# Patient Record
Sex: Male | Born: 1935 | Race: Black or African American | Hispanic: No | Smoking: Former smoker
Health system: Southern US, Community
[De-identification: ages and names within clinical notes are randomized; demographics above are authoritative.]

## PROBLEM LIST (undated history)

## (undated) DIAGNOSIS — E7800 Pure hypercholesterolemia, unspecified: Secondary | ICD-10-CM

## (undated) DIAGNOSIS — N400 Enlarged prostate without lower urinary tract symptoms: Secondary | ICD-10-CM

## (undated) DIAGNOSIS — E559 Vitamin D deficiency, unspecified: Secondary | ICD-10-CM

## (undated) DIAGNOSIS — I10 Essential (primary) hypertension: Secondary | ICD-10-CM

## (undated) HISTORY — PX: HERNIA REPAIR: SHX51

## (undated) HISTORY — PX: ANKLE SURGERY: SHX546

---

## 1998-03-26 ENCOUNTER — Other Ambulatory Visit: Admission: RE | Admit: 1998-03-26 | Discharge: 1998-03-26 | Payer: Self-pay | Admitting: Nephrology

## 1998-04-23 ENCOUNTER — Other Ambulatory Visit: Admission: RE | Admit: 1998-04-23 | Discharge: 1998-04-23 | Payer: Self-pay | Admitting: Nephrology

## 2002-11-25 ENCOUNTER — Ambulatory Visit (HOSPITAL_COMMUNITY): Admission: RE | Admit: 2002-11-25 | Discharge: 2002-11-25 | Payer: Self-pay | Admitting: *Deleted

## 2002-11-25 ENCOUNTER — Encounter: Payer: Self-pay | Admitting: *Deleted

## 2005-11-16 ENCOUNTER — Encounter: Admission: RE | Admit: 2005-11-16 | Discharge: 2005-11-16 | Payer: Self-pay | Admitting: General Surgery

## 2005-11-19 ENCOUNTER — Ambulatory Visit (HOSPITAL_BASED_OUTPATIENT_CLINIC_OR_DEPARTMENT_OTHER): Admission: RE | Admit: 2005-11-19 | Discharge: 2005-11-19 | Payer: Self-pay | Admitting: General Surgery

## 2007-02-03 IMAGING — CR DG CHEST 2V
2 series · 2 of 2 positions shown · non-contrast
Comparison: none

CLINICAL DATA: Preoperative respiratory exam.  Inguinal hernia.  401.9.
 CHEST ? 2 VIEW:

[view not recorded (1 of 2)]
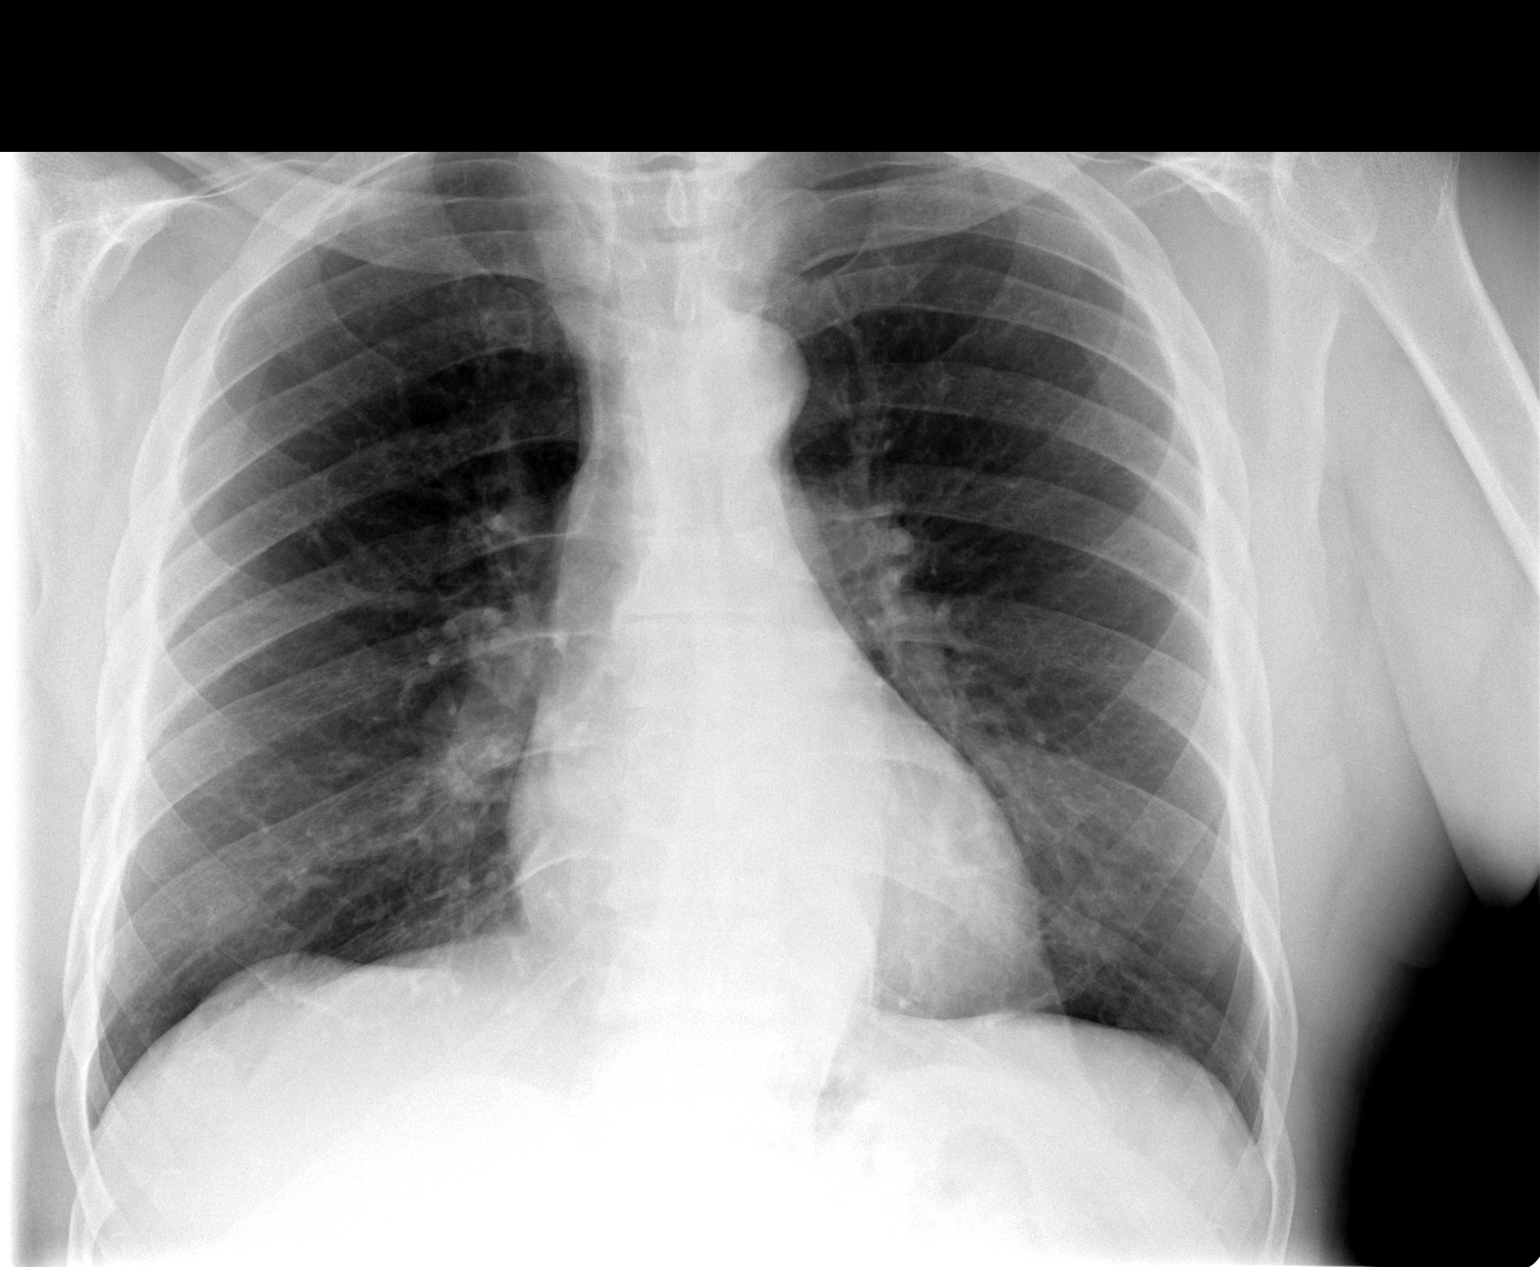

[view not recorded (2 of 2)]
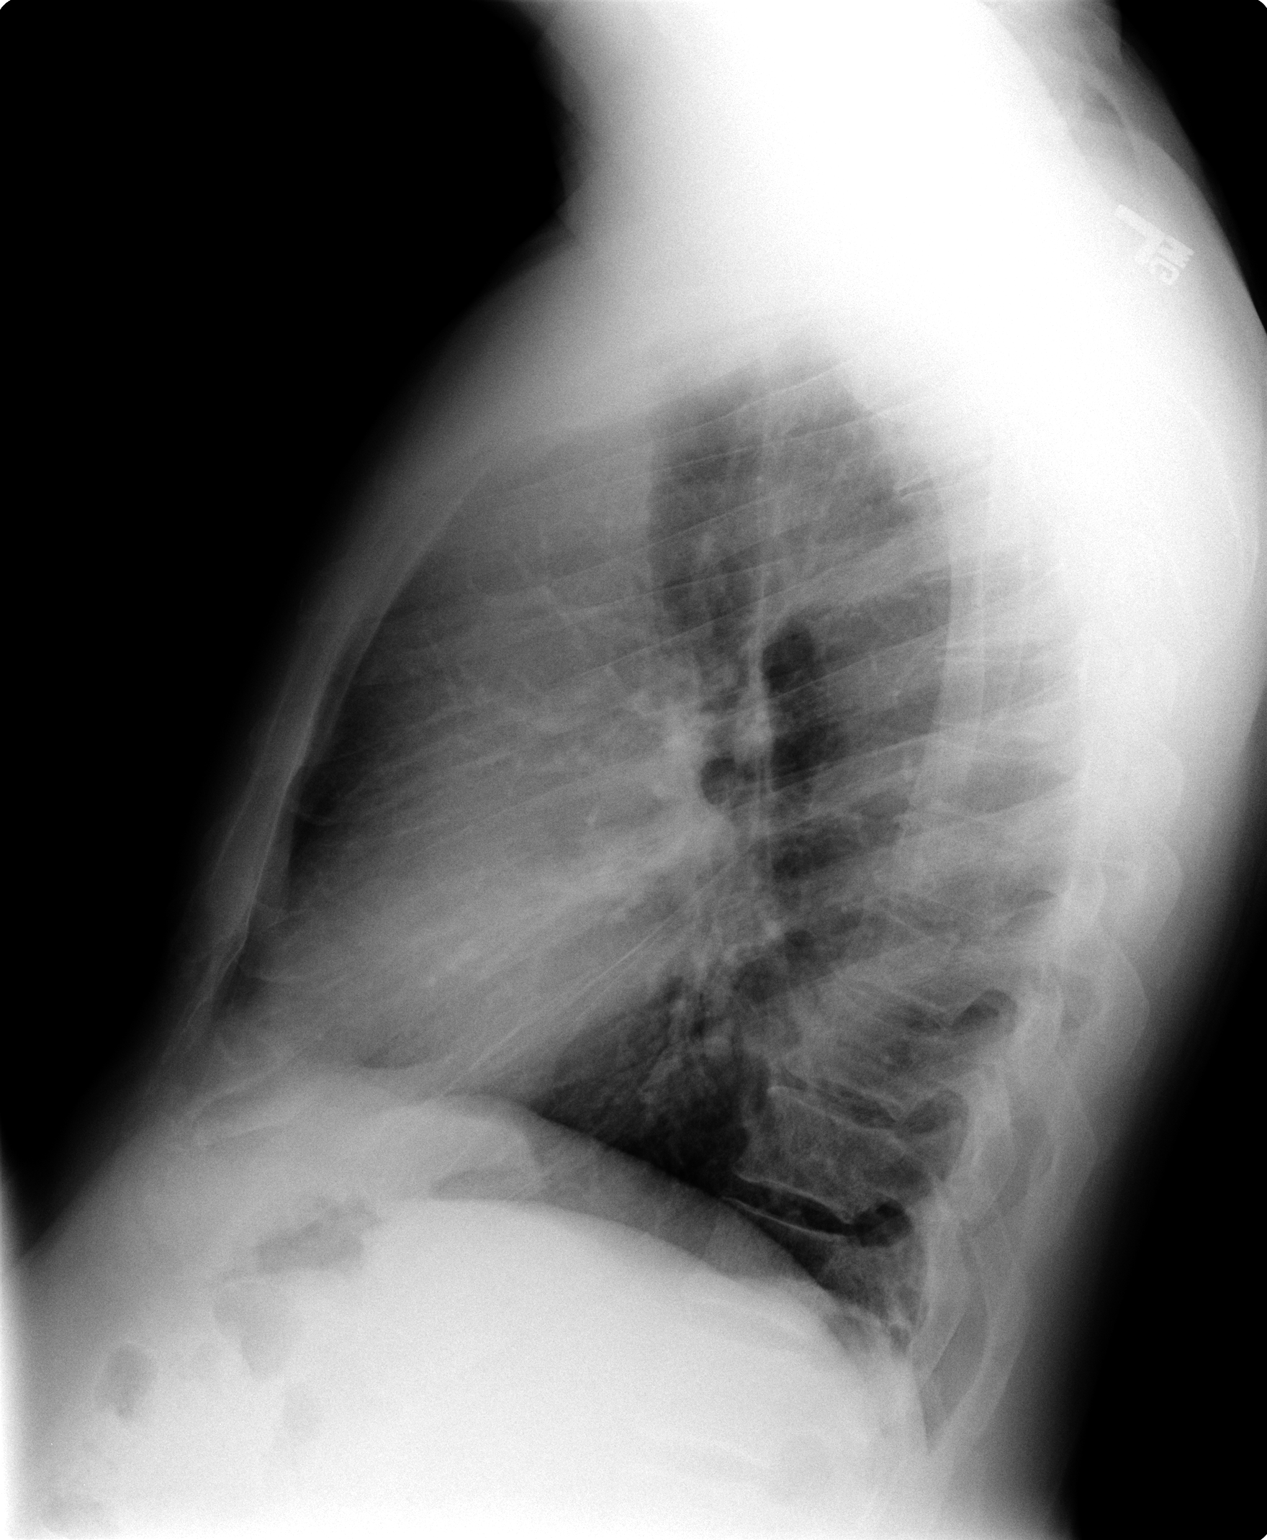

[2 of 2 positions shown; findings below may reference images not displayed]

FINDINGS: Heart size and vascularity are normal and the lungs are clear.   There is some tortuosity of the thoracic aorta.  No significant bony abnormality.
IMPRESSION: No acute disease.

## 2011-06-10 ENCOUNTER — Other Ambulatory Visit: Payer: Self-pay | Admitting: Nephrology

## 2011-06-10 ENCOUNTER — Ambulatory Visit
Admission: RE | Admit: 2011-06-10 | Discharge: 2011-06-10 | Disposition: A | Discharge status: Home / Self Care | Payer: Medicare Other | Source: Ambulatory Visit | Attending: Nephrology | Admitting: Nephrology

## 2011-06-10 DIAGNOSIS — R05 Cough: Secondary | ICD-10-CM

## 2012-05-25 ENCOUNTER — Other Ambulatory Visit: Payer: Self-pay | Admitting: Nephrology

## 2012-06-23 ENCOUNTER — Other Ambulatory Visit: Payer: Self-pay | Admitting: Nephrology

## 2012-07-27 ENCOUNTER — Other Ambulatory Visit: Payer: Self-pay | Admitting: Nephrology

## 2012-08-27 IMAGING — CR DG CHEST 2V
2 series · 2 of 2 positions shown · non-contrast
Comparison: Of the chest x-ray of 11/16/2005

CLINICAL DATA: Cough, weight loss, former smoker

CHEST - 2 VIEW

[w chest pa]
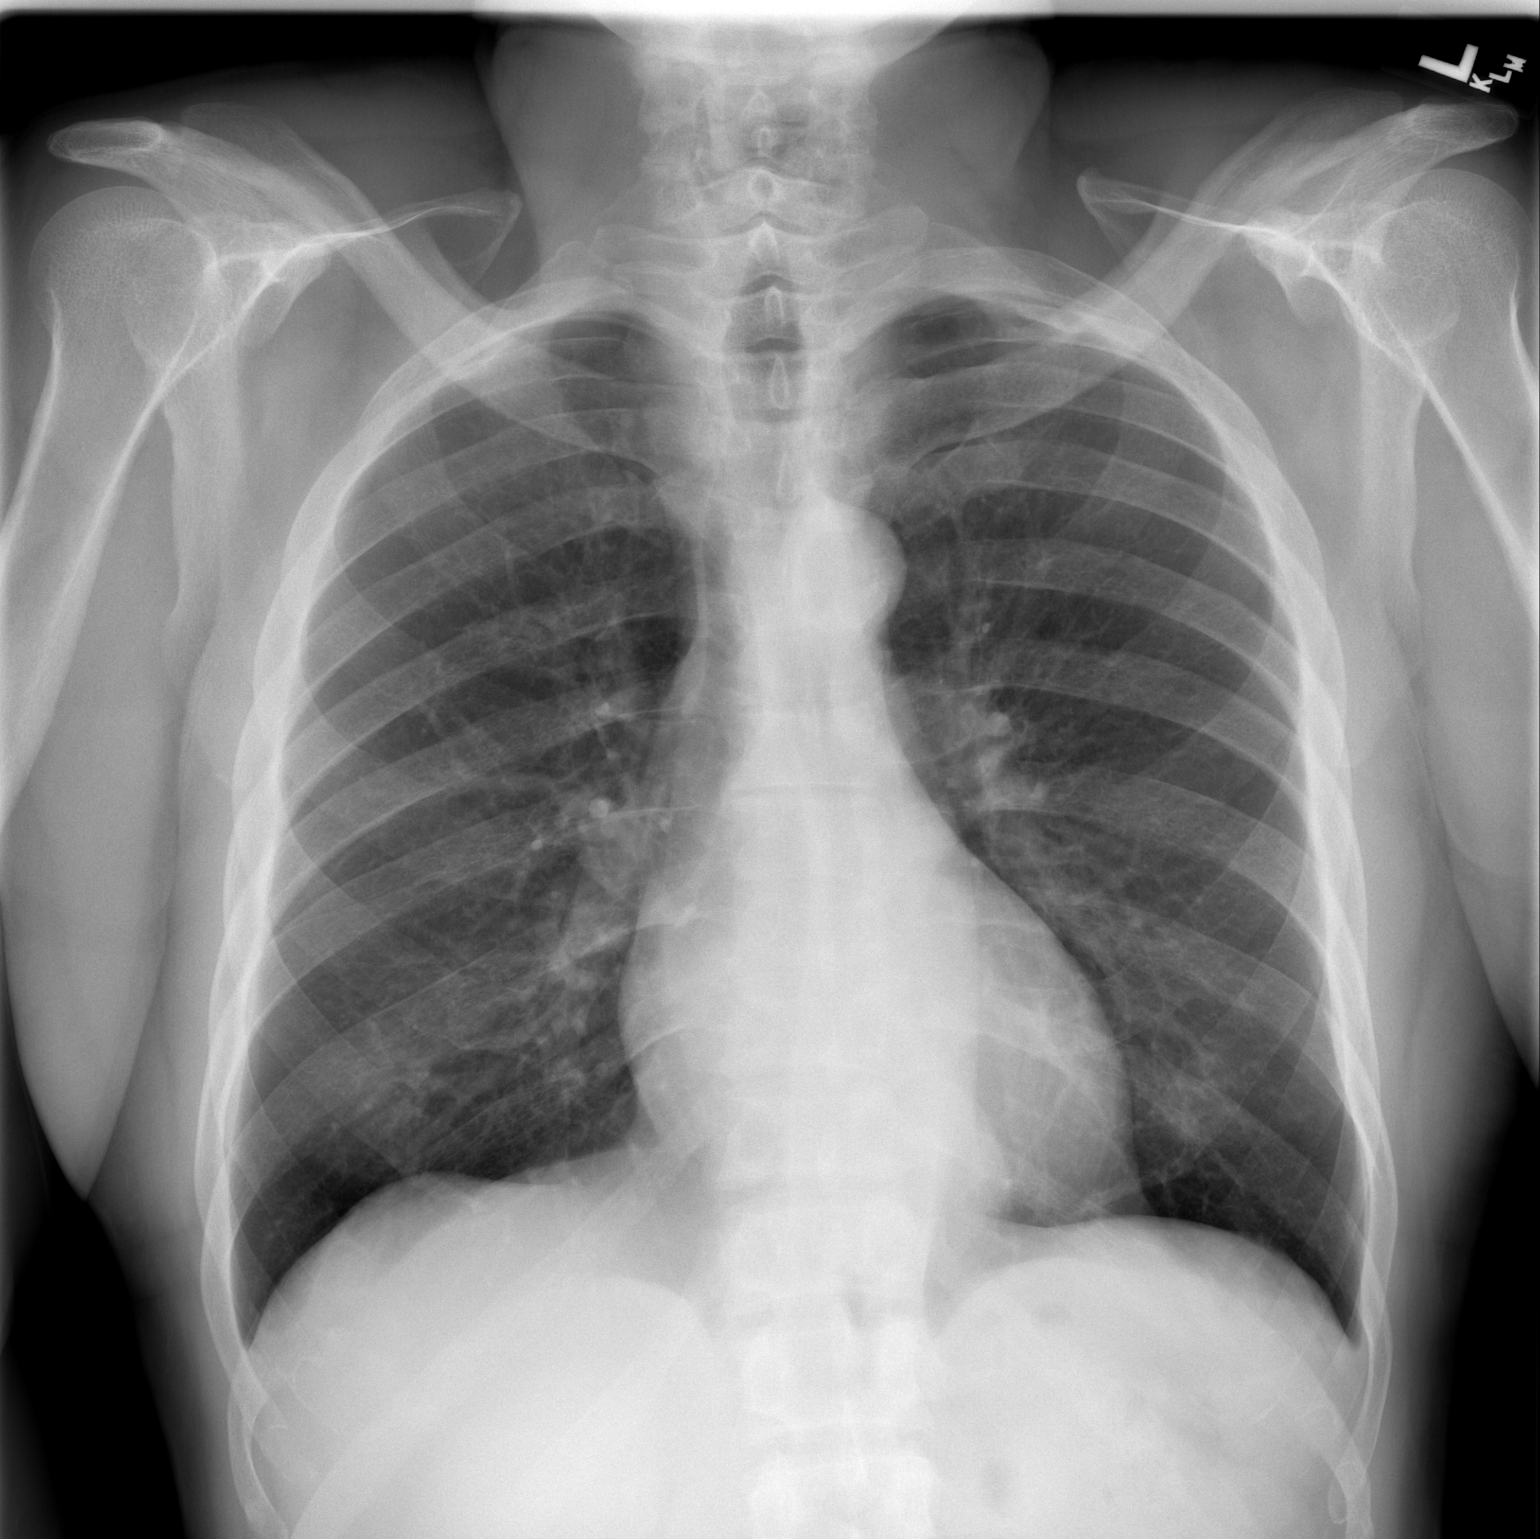

[w chest lat]
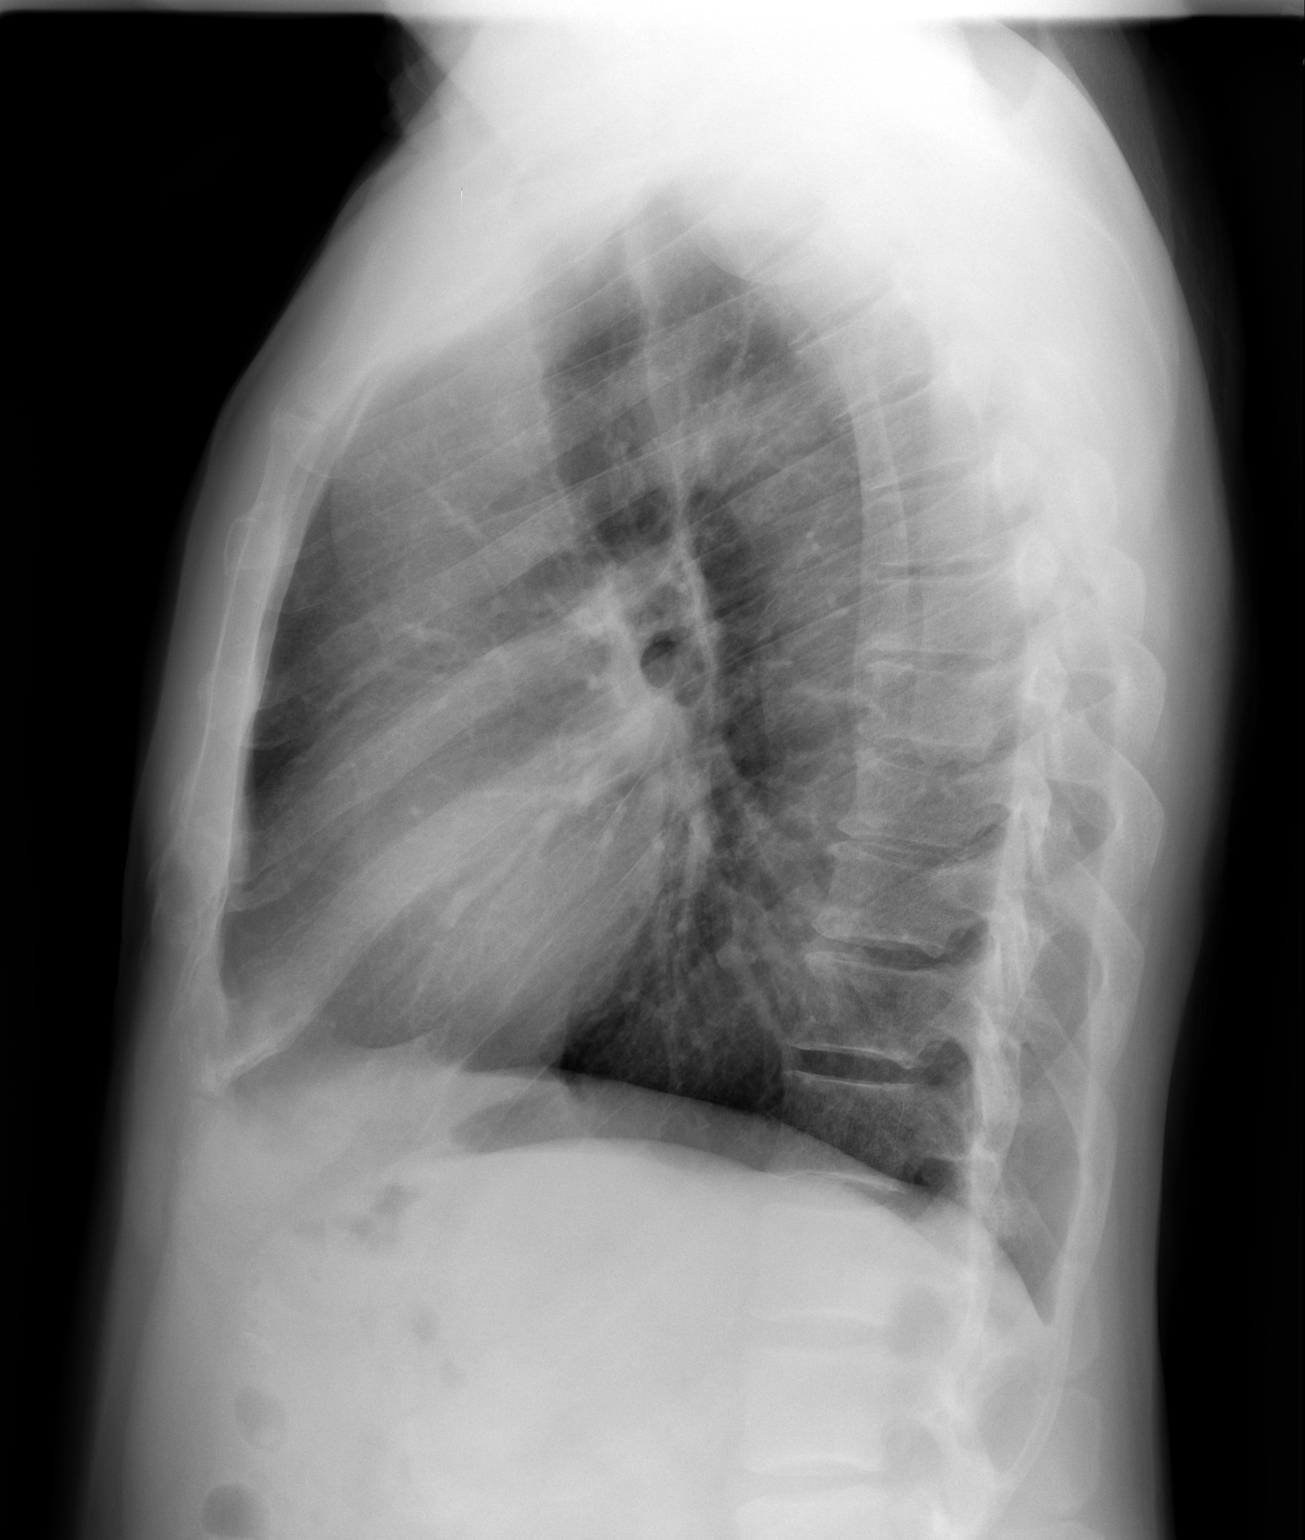

[2 of 2 positions shown; findings below may reference images not displayed]

FINDINGS: The lungs are clear and slightly hyperaerated.
Mediastinal contours are stable.  The heart is within normal limits
in size.  A rounded opacity at the left cardiophrenic angle
probably represents a small hiatal hernia.
IMPRESSION: 1.  No active lung disease.
2.  Probable small hiatal hernia.

## 2015-06-16 DIAGNOSIS — R05 Cough: Secondary | ICD-10-CM | POA: Insufficient documentation

## 2015-06-16 DIAGNOSIS — I159 Secondary hypertension, unspecified: Secondary | ICD-10-CM

## 2015-06-16 DIAGNOSIS — K219 Gastro-esophageal reflux disease without esophagitis: Secondary | ICD-10-CM

## 2015-06-16 DIAGNOSIS — I10 Essential (primary) hypertension: Secondary | ICD-10-CM | POA: Insufficient documentation

## 2015-06-16 DIAGNOSIS — R059 Cough, unspecified: Secondary | ICD-10-CM | POA: Insufficient documentation

## 2015-06-16 DIAGNOSIS — R062 Wheezing: Secondary | ICD-10-CM

## 2015-06-16 DIAGNOSIS — J309 Allergic rhinitis, unspecified: Secondary | ICD-10-CM

## 2015-07-23 ENCOUNTER — Ambulatory Visit (INDEPENDENT_AMBULATORY_CARE_PROVIDER_SITE_OTHER): Payer: Medicare HMO | Admitting: *Deleted

## 2015-07-23 DIAGNOSIS — J309 Allergic rhinitis, unspecified: Secondary | ICD-10-CM

## 2015-07-23 MED ORDER — EPINEPHRINE 0.3 MG/0.3ML IJ SOAJ: 0.3000 mg | Freq: Once | INTRAMUSCULAR | Status: AC

## 2015-07-25 ENCOUNTER — Encounter: Payer: Self-pay | Admitting: *Deleted

## 2015-08-06 ENCOUNTER — Ambulatory Visit (INDEPENDENT_AMBULATORY_CARE_PROVIDER_SITE_OTHER): Payer: Medicare HMO

## 2015-08-06 DIAGNOSIS — J309 Allergic rhinitis, unspecified: Secondary | ICD-10-CM

## 2015-08-19 ENCOUNTER — Ambulatory Visit (INDEPENDENT_AMBULATORY_CARE_PROVIDER_SITE_OTHER): Payer: Medicare HMO | Admitting: *Deleted

## 2015-08-19 DIAGNOSIS — J309 Allergic rhinitis, unspecified: Secondary | ICD-10-CM

## 2015-09-11 ENCOUNTER — Encounter: Payer: Self-pay | Admitting: *Deleted

## 2015-09-11 ENCOUNTER — Ambulatory Visit (INDEPENDENT_AMBULATORY_CARE_PROVIDER_SITE_OTHER): Payer: Medicare HMO | Admitting: *Deleted

## 2015-09-11 DIAGNOSIS — J309 Allergic rhinitis, unspecified: Secondary | ICD-10-CM | POA: Diagnosis not present

## 2015-09-11 NOTE — Progress Notes (Signed)
error 

## 2015-09-22 ENCOUNTER — Ambulatory Visit (INDEPENDENT_AMBULATORY_CARE_PROVIDER_SITE_OTHER): Payer: Medicare HMO

## 2015-09-22 DIAGNOSIS — J309 Allergic rhinitis, unspecified: Secondary | ICD-10-CM | POA: Diagnosis not present

## 2015-10-08 ENCOUNTER — Ambulatory Visit (INDEPENDENT_AMBULATORY_CARE_PROVIDER_SITE_OTHER): Payer: Medicare HMO

## 2015-10-08 DIAGNOSIS — J309 Allergic rhinitis, unspecified: Secondary | ICD-10-CM

## 2015-10-22 DIAGNOSIS — J3089 Other allergic rhinitis: Secondary | ICD-10-CM | POA: Diagnosis not present

## 2015-10-23 DIAGNOSIS — J301 Allergic rhinitis due to pollen: Secondary | ICD-10-CM | POA: Diagnosis not present

## 2015-11-11 ENCOUNTER — Ambulatory Visit (INDEPENDENT_AMBULATORY_CARE_PROVIDER_SITE_OTHER): Payer: Medicare HMO

## 2015-11-11 DIAGNOSIS — J309 Allergic rhinitis, unspecified: Secondary | ICD-10-CM

## 2015-12-10 ENCOUNTER — Ambulatory Visit (INDEPENDENT_AMBULATORY_CARE_PROVIDER_SITE_OTHER): Payer: Medicare HMO

## 2015-12-10 DIAGNOSIS — J309 Allergic rhinitis, unspecified: Secondary | ICD-10-CM

## 2015-12-29 DIAGNOSIS — N400 Enlarged prostate without lower urinary tract symptoms: Secondary | ICD-10-CM | POA: Diagnosis not present

## 2015-12-29 DIAGNOSIS — E785 Hyperlipidemia, unspecified: Secondary | ICD-10-CM | POA: Diagnosis not present

## 2015-12-29 DIAGNOSIS — K219 Gastro-esophageal reflux disease without esophagitis: Secondary | ICD-10-CM | POA: Diagnosis not present

## 2015-12-29 DIAGNOSIS — R7303 Prediabetes: Secondary | ICD-10-CM | POA: Diagnosis not present

## 2015-12-29 DIAGNOSIS — E559 Vitamin D deficiency, unspecified: Secondary | ICD-10-CM | POA: Diagnosis not present

## 2015-12-29 DIAGNOSIS — I10 Essential (primary) hypertension: Secondary | ICD-10-CM | POA: Diagnosis not present

## 2015-12-29 DIAGNOSIS — I119 Hypertensive heart disease without heart failure: Secondary | ICD-10-CM | POA: Diagnosis not present

## 2016-01-07 ENCOUNTER — Ambulatory Visit (INDEPENDENT_AMBULATORY_CARE_PROVIDER_SITE_OTHER): Payer: Medicare HMO

## 2016-01-07 DIAGNOSIS — J309 Allergic rhinitis, unspecified: Secondary | ICD-10-CM | POA: Diagnosis not present

## 2016-01-29 DIAGNOSIS — I10 Essential (primary) hypertension: Secondary | ICD-10-CM | POA: Diagnosis not present

## 2016-01-29 DIAGNOSIS — E559 Vitamin D deficiency, unspecified: Secondary | ICD-10-CM | POA: Diagnosis not present

## 2016-02-05 ENCOUNTER — Ambulatory Visit (INDEPENDENT_AMBULATORY_CARE_PROVIDER_SITE_OTHER): Payer: Medicare HMO | Admitting: *Deleted

## 2016-02-05 DIAGNOSIS — J309 Allergic rhinitis, unspecified: Secondary | ICD-10-CM

## 2016-02-26 DIAGNOSIS — Z0000 Encounter for general adult medical examination without abnormal findings: Secondary | ICD-10-CM | POA: Diagnosis not present

## 2016-02-26 DIAGNOSIS — N401 Enlarged prostate with lower urinary tract symptoms: Secondary | ICD-10-CM | POA: Diagnosis not present

## 2016-02-26 DIAGNOSIS — R972 Elevated prostate specific antigen [PSA]: Secondary | ICD-10-CM | POA: Diagnosis not present

## 2016-02-26 DIAGNOSIS — R351 Nocturia: Secondary | ICD-10-CM | POA: Diagnosis not present

## 2016-02-26 DIAGNOSIS — N138 Other obstructive and reflux uropathy: Secondary | ICD-10-CM | POA: Diagnosis not present

## 2016-02-26 DIAGNOSIS — F5221 Male erectile disorder: Secondary | ICD-10-CM | POA: Diagnosis not present

## 2016-03-02 ENCOUNTER — Ambulatory Visit (INDEPENDENT_AMBULATORY_CARE_PROVIDER_SITE_OTHER): Payer: Medicare HMO

## 2016-03-02 DIAGNOSIS — J309 Allergic rhinitis, unspecified: Secondary | ICD-10-CM

## 2016-03-08 DIAGNOSIS — I10 Essential (primary) hypertension: Secondary | ICD-10-CM | POA: Diagnosis not present

## 2016-03-08 DIAGNOSIS — N400 Enlarged prostate without lower urinary tract symptoms: Secondary | ICD-10-CM | POA: Diagnosis not present

## 2016-03-08 DIAGNOSIS — Z0000 Encounter for general adult medical examination without abnormal findings: Secondary | ICD-10-CM | POA: Diagnosis not present

## 2016-03-08 DIAGNOSIS — I119 Hypertensive heart disease without heart failure: Secondary | ICD-10-CM | POA: Diagnosis not present

## 2016-03-08 DIAGNOSIS — Z136 Encounter for screening for cardiovascular disorders: Secondary | ICD-10-CM | POA: Diagnosis not present

## 2016-03-08 DIAGNOSIS — K219 Gastro-esophageal reflux disease without esophagitis: Secondary | ICD-10-CM | POA: Diagnosis not present

## 2016-03-08 DIAGNOSIS — E785 Hyperlipidemia, unspecified: Secondary | ICD-10-CM | POA: Diagnosis not present

## 2016-03-08 DIAGNOSIS — Z131 Encounter for screening for diabetes mellitus: Secondary | ICD-10-CM | POA: Diagnosis not present

## 2016-03-08 DIAGNOSIS — Z01118 Encounter for examination of ears and hearing with other abnormal findings: Secondary | ICD-10-CM | POA: Diagnosis not present

## 2016-03-11 DIAGNOSIS — I10 Essential (primary) hypertension: Secondary | ICD-10-CM | POA: Diagnosis not present

## 2016-03-11 DIAGNOSIS — E559 Vitamin D deficiency, unspecified: Secondary | ICD-10-CM | POA: Diagnosis not present

## 2016-03-17 ENCOUNTER — Ambulatory Visit (INDEPENDENT_AMBULATORY_CARE_PROVIDER_SITE_OTHER): Payer: Medicare HMO

## 2016-03-17 DIAGNOSIS — J309 Allergic rhinitis, unspecified: Secondary | ICD-10-CM

## 2016-04-05 ENCOUNTER — Ambulatory Visit (INDEPENDENT_AMBULATORY_CARE_PROVIDER_SITE_OTHER): Payer: Medicare HMO

## 2016-04-05 DIAGNOSIS — J309 Allergic rhinitis, unspecified: Secondary | ICD-10-CM

## 2016-04-07 DIAGNOSIS — I10 Essential (primary) hypertension: Secondary | ICD-10-CM | POA: Diagnosis not present

## 2016-04-07 DIAGNOSIS — E559 Vitamin D deficiency, unspecified: Secondary | ICD-10-CM | POA: Diagnosis not present

## 2016-05-03 DIAGNOSIS — E11649 Type 2 diabetes mellitus with hypoglycemia without coma: Secondary | ICD-10-CM | POA: Diagnosis not present

## 2016-05-03 DIAGNOSIS — E785 Hyperlipidemia, unspecified: Secondary | ICD-10-CM | POA: Diagnosis not present

## 2016-05-03 DIAGNOSIS — N400 Enlarged prostate without lower urinary tract symptoms: Secondary | ICD-10-CM | POA: Diagnosis not present

## 2016-05-03 DIAGNOSIS — E559 Vitamin D deficiency, unspecified: Secondary | ICD-10-CM | POA: Diagnosis not present

## 2016-05-03 DIAGNOSIS — I119 Hypertensive heart disease without heart failure: Secondary | ICD-10-CM | POA: Diagnosis not present

## 2016-05-03 DIAGNOSIS — I10 Essential (primary) hypertension: Secondary | ICD-10-CM | POA: Diagnosis not present

## 2016-05-03 DIAGNOSIS — K219 Gastro-esophageal reflux disease without esophagitis: Secondary | ICD-10-CM | POA: Diagnosis not present

## 2016-05-04 ENCOUNTER — Ambulatory Visit (INDEPENDENT_AMBULATORY_CARE_PROVIDER_SITE_OTHER): Payer: Medicare HMO

## 2016-05-04 DIAGNOSIS — J309 Allergic rhinitis, unspecified: Secondary | ICD-10-CM | POA: Diagnosis not present

## 2016-05-10 ENCOUNTER — Ambulatory Visit (INDEPENDENT_AMBULATORY_CARE_PROVIDER_SITE_OTHER): Payer: Medicare HMO | Admitting: *Deleted

## 2016-05-10 DIAGNOSIS — J309 Allergic rhinitis, unspecified: Secondary | ICD-10-CM | POA: Diagnosis not present

## 2016-05-17 DIAGNOSIS — E559 Vitamin D deficiency, unspecified: Secondary | ICD-10-CM | POA: Diagnosis not present

## 2016-05-17 DIAGNOSIS — I10 Essential (primary) hypertension: Secondary | ICD-10-CM | POA: Diagnosis not present

## 2016-05-26 ENCOUNTER — Ambulatory Visit (INDEPENDENT_AMBULATORY_CARE_PROVIDER_SITE_OTHER): Payer: Medicare HMO

## 2016-05-26 DIAGNOSIS — J309 Allergic rhinitis, unspecified: Secondary | ICD-10-CM

## 2016-06-08 DIAGNOSIS — J309 Allergic rhinitis, unspecified: Secondary | ICD-10-CM

## 2016-06-10 DIAGNOSIS — E11649 Type 2 diabetes mellitus with hypoglycemia without coma: Secondary | ICD-10-CM | POA: Diagnosis not present

## 2016-06-23 ENCOUNTER — Ambulatory Visit (INDEPENDENT_AMBULATORY_CARE_PROVIDER_SITE_OTHER): Payer: Medicare HMO

## 2016-06-23 DIAGNOSIS — J309 Allergic rhinitis, unspecified: Secondary | ICD-10-CM

## 2016-07-05 DIAGNOSIS — E11649 Type 2 diabetes mellitus with hypoglycemia without coma: Secondary | ICD-10-CM | POA: Diagnosis not present

## 2016-07-05 DIAGNOSIS — Z0000 Encounter for general adult medical examination without abnormal findings: Secondary | ICD-10-CM | POA: Diagnosis not present

## 2016-07-05 DIAGNOSIS — E785 Hyperlipidemia, unspecified: Secondary | ICD-10-CM | POA: Diagnosis not present

## 2016-07-05 DIAGNOSIS — I119 Hypertensive heart disease without heart failure: Secondary | ICD-10-CM | POA: Diagnosis not present

## 2016-07-05 DIAGNOSIS — E559 Vitamin D deficiency, unspecified: Secondary | ICD-10-CM | POA: Diagnosis not present

## 2016-07-05 DIAGNOSIS — K219 Gastro-esophageal reflux disease without esophagitis: Secondary | ICD-10-CM | POA: Diagnosis not present

## 2016-07-05 DIAGNOSIS — N400 Enlarged prostate without lower urinary tract symptoms: Secondary | ICD-10-CM | POA: Diagnosis not present

## 2016-07-05 DIAGNOSIS — I10 Essential (primary) hypertension: Secondary | ICD-10-CM | POA: Diagnosis not present

## 2016-07-21 ENCOUNTER — Ambulatory Visit (INDEPENDENT_AMBULATORY_CARE_PROVIDER_SITE_OTHER): Payer: Medicare HMO

## 2016-07-21 DIAGNOSIS — J309 Allergic rhinitis, unspecified: Secondary | ICD-10-CM

## 2016-08-12 DIAGNOSIS — J3089 Other allergic rhinitis: Secondary | ICD-10-CM | POA: Diagnosis not present

## 2016-08-13 DIAGNOSIS — J301 Allergic rhinitis due to pollen: Secondary | ICD-10-CM | POA: Diagnosis not present

## 2016-08-18 ENCOUNTER — Ambulatory Visit (INDEPENDENT_AMBULATORY_CARE_PROVIDER_SITE_OTHER): Payer: Medicare HMO

## 2016-08-18 DIAGNOSIS — J309 Allergic rhinitis, unspecified: Secondary | ICD-10-CM

## 2016-08-30 DIAGNOSIS — N5201 Erectile dysfunction due to arterial insufficiency: Secondary | ICD-10-CM | POA: Diagnosis not present

## 2016-08-30 DIAGNOSIS — R972 Elevated prostate specific antigen [PSA]: Secondary | ICD-10-CM | POA: Diagnosis not present

## 2016-08-30 DIAGNOSIS — N401 Enlarged prostate with lower urinary tract symptoms: Secondary | ICD-10-CM | POA: Diagnosis not present

## 2016-08-30 DIAGNOSIS — R351 Nocturia: Secondary | ICD-10-CM | POA: Diagnosis not present

## 2016-08-30 DIAGNOSIS — R350 Frequency of micturition: Secondary | ICD-10-CM | POA: Diagnosis not present

## 2016-09-09 DIAGNOSIS — I119 Hypertensive heart disease without heart failure: Secondary | ICD-10-CM | POA: Diagnosis not present

## 2016-09-09 DIAGNOSIS — E785 Hyperlipidemia, unspecified: Secondary | ICD-10-CM | POA: Diagnosis not present

## 2016-09-09 DIAGNOSIS — N400 Enlarged prostate without lower urinary tract symptoms: Secondary | ICD-10-CM | POA: Diagnosis not present

## 2016-09-09 DIAGNOSIS — E11649 Type 2 diabetes mellitus with hypoglycemia without coma: Secondary | ICD-10-CM | POA: Diagnosis not present

## 2016-09-09 DIAGNOSIS — E559 Vitamin D deficiency, unspecified: Secondary | ICD-10-CM | POA: Diagnosis not present

## 2016-09-09 DIAGNOSIS — I10 Essential (primary) hypertension: Secondary | ICD-10-CM | POA: Diagnosis not present

## 2016-09-09 DIAGNOSIS — K219 Gastro-esophageal reflux disease without esophagitis: Secondary | ICD-10-CM | POA: Diagnosis not present

## 2016-09-15 ENCOUNTER — Ambulatory Visit (INDEPENDENT_AMBULATORY_CARE_PROVIDER_SITE_OTHER): Payer: Medicare HMO | Admitting: *Deleted

## 2016-09-15 DIAGNOSIS — J309 Allergic rhinitis, unspecified: Secondary | ICD-10-CM

## 2016-10-13 ENCOUNTER — Ambulatory Visit (INDEPENDENT_AMBULATORY_CARE_PROVIDER_SITE_OTHER): Payer: Medicare HMO | Admitting: *Deleted

## 2016-10-13 DIAGNOSIS — J309 Allergic rhinitis, unspecified: Secondary | ICD-10-CM

## 2016-11-09 ENCOUNTER — Ambulatory Visit (INDEPENDENT_AMBULATORY_CARE_PROVIDER_SITE_OTHER): Payer: Medicare HMO

## 2016-11-09 DIAGNOSIS — J309 Allergic rhinitis, unspecified: Secondary | ICD-10-CM | POA: Diagnosis not present

## 2016-11-17 ENCOUNTER — Ambulatory Visit (INDEPENDENT_AMBULATORY_CARE_PROVIDER_SITE_OTHER): Payer: Medicare HMO | Admitting: *Deleted

## 2016-11-17 DIAGNOSIS — J309 Allergic rhinitis, unspecified: Secondary | ICD-10-CM

## 2016-11-24 ENCOUNTER — Ambulatory Visit (INDEPENDENT_AMBULATORY_CARE_PROVIDER_SITE_OTHER): Payer: Medicare HMO | Admitting: *Deleted

## 2016-11-24 DIAGNOSIS — J309 Allergic rhinitis, unspecified: Secondary | ICD-10-CM

## 2016-12-01 ENCOUNTER — Ambulatory Visit (INDEPENDENT_AMBULATORY_CARE_PROVIDER_SITE_OTHER): Payer: Medicare HMO | Admitting: *Deleted

## 2016-12-01 DIAGNOSIS — J309 Allergic rhinitis, unspecified: Secondary | ICD-10-CM | POA: Diagnosis not present

## 2016-12-06 DIAGNOSIS — I119 Hypertensive heart disease without heart failure: Secondary | ICD-10-CM | POA: Diagnosis not present

## 2016-12-06 DIAGNOSIS — Z0000 Encounter for general adult medical examination without abnormal findings: Secondary | ICD-10-CM | POA: Diagnosis not present

## 2016-12-06 DIAGNOSIS — E785 Hyperlipidemia, unspecified: Secondary | ICD-10-CM | POA: Diagnosis not present

## 2016-12-06 DIAGNOSIS — E559 Vitamin D deficiency, unspecified: Secondary | ICD-10-CM | POA: Diagnosis not present

## 2016-12-06 DIAGNOSIS — I10 Essential (primary) hypertension: Secondary | ICD-10-CM | POA: Diagnosis not present

## 2016-12-06 DIAGNOSIS — E11649 Type 2 diabetes mellitus with hypoglycemia without coma: Secondary | ICD-10-CM | POA: Diagnosis not present

## 2016-12-06 DIAGNOSIS — K219 Gastro-esophageal reflux disease without esophagitis: Secondary | ICD-10-CM | POA: Diagnosis not present

## 2016-12-06 DIAGNOSIS — N400 Enlarged prostate without lower urinary tract symptoms: Secondary | ICD-10-CM | POA: Diagnosis not present

## 2016-12-14 ENCOUNTER — Ambulatory Visit (INDEPENDENT_AMBULATORY_CARE_PROVIDER_SITE_OTHER): Payer: Medicare HMO

## 2016-12-14 DIAGNOSIS — J309 Allergic rhinitis, unspecified: Secondary | ICD-10-CM

## 2017-01-12 ENCOUNTER — Ambulatory Visit (INDEPENDENT_AMBULATORY_CARE_PROVIDER_SITE_OTHER): Payer: Medicare HMO

## 2017-01-12 DIAGNOSIS — J309 Allergic rhinitis, unspecified: Secondary | ICD-10-CM

## 2017-02-02 ENCOUNTER — Ambulatory Visit (INDEPENDENT_AMBULATORY_CARE_PROVIDER_SITE_OTHER): Payer: Medicare HMO

## 2017-02-02 DIAGNOSIS — J309 Allergic rhinitis, unspecified: Secondary | ICD-10-CM

## 2017-02-09 DIAGNOSIS — J301 Allergic rhinitis due to pollen: Secondary | ICD-10-CM | POA: Diagnosis not present

## 2017-02-10 DIAGNOSIS — J3081 Allergic rhinitis due to animal (cat) (dog) hair and dander: Secondary | ICD-10-CM | POA: Diagnosis not present

## 2017-02-11 DIAGNOSIS — J3089 Other allergic rhinitis: Secondary | ICD-10-CM | POA: Diagnosis not present

## 2017-03-02 DIAGNOSIS — R972 Elevated prostate specific antigen [PSA]: Secondary | ICD-10-CM | POA: Diagnosis not present

## 2017-03-07 DIAGNOSIS — R972 Elevated prostate specific antigen [PSA]: Secondary | ICD-10-CM | POA: Diagnosis not present

## 2017-03-07 DIAGNOSIS — N5201 Erectile dysfunction due to arterial insufficiency: Secondary | ICD-10-CM | POA: Diagnosis not present

## 2017-03-07 DIAGNOSIS — R350 Frequency of micturition: Secondary | ICD-10-CM | POA: Diagnosis not present

## 2017-03-10 ENCOUNTER — Ambulatory Visit (INDEPENDENT_AMBULATORY_CARE_PROVIDER_SITE_OTHER): Payer: Medicare HMO

## 2017-03-10 DIAGNOSIS — J309 Allergic rhinitis, unspecified: Secondary | ICD-10-CM | POA: Diagnosis not present

## 2017-03-28 DIAGNOSIS — N400 Enlarged prostate without lower urinary tract symptoms: Secondary | ICD-10-CM | POA: Diagnosis not present

## 2017-03-28 DIAGNOSIS — E785 Hyperlipidemia, unspecified: Secondary | ICD-10-CM | POA: Diagnosis not present

## 2017-03-28 DIAGNOSIS — I10 Essential (primary) hypertension: Secondary | ICD-10-CM | POA: Diagnosis not present

## 2017-03-28 DIAGNOSIS — E559 Vitamin D deficiency, unspecified: Secondary | ICD-10-CM | POA: Diagnosis not present

## 2017-03-28 DIAGNOSIS — E11649 Type 2 diabetes mellitus with hypoglycemia without coma: Secondary | ICD-10-CM | POA: Diagnosis not present

## 2017-03-28 DIAGNOSIS — N900 Mild vulvar dysplasia: Secondary | ICD-10-CM | POA: Diagnosis not present

## 2017-03-28 DIAGNOSIS — I119 Hypertensive heart disease without heart failure: Secondary | ICD-10-CM | POA: Diagnosis not present

## 2017-03-28 DIAGNOSIS — K219 Gastro-esophageal reflux disease without esophagitis: Secondary | ICD-10-CM | POA: Diagnosis not present

## 2017-04-06 ENCOUNTER — Ambulatory Visit (INDEPENDENT_AMBULATORY_CARE_PROVIDER_SITE_OTHER): Payer: Medicare HMO

## 2017-04-06 DIAGNOSIS — J309 Allergic rhinitis, unspecified: Secondary | ICD-10-CM

## 2017-05-09 ENCOUNTER — Ambulatory Visit (INDEPENDENT_AMBULATORY_CARE_PROVIDER_SITE_OTHER): Payer: Medicare HMO

## 2017-05-09 DIAGNOSIS — J309 Allergic rhinitis, unspecified: Secondary | ICD-10-CM

## 2017-06-01 ENCOUNTER — Ambulatory Visit (INDEPENDENT_AMBULATORY_CARE_PROVIDER_SITE_OTHER): Payer: Medicare HMO

## 2017-06-01 DIAGNOSIS — J309 Allergic rhinitis, unspecified: Secondary | ICD-10-CM

## 2017-06-14 ENCOUNTER — Ambulatory Visit (INDEPENDENT_AMBULATORY_CARE_PROVIDER_SITE_OTHER): Payer: Medicare HMO

## 2017-06-14 DIAGNOSIS — J309 Allergic rhinitis, unspecified: Secondary | ICD-10-CM

## 2017-06-29 ENCOUNTER — Ambulatory Visit (INDEPENDENT_AMBULATORY_CARE_PROVIDER_SITE_OTHER): Payer: Medicare HMO | Admitting: *Deleted

## 2017-06-29 DIAGNOSIS — J309 Allergic rhinitis, unspecified: Secondary | ICD-10-CM

## 2017-07-05 DIAGNOSIS — E559 Vitamin D deficiency, unspecified: Secondary | ICD-10-CM | POA: Diagnosis not present

## 2017-07-05 DIAGNOSIS — I10 Essential (primary) hypertension: Secondary | ICD-10-CM | POA: Diagnosis not present

## 2017-07-05 DIAGNOSIS — E11649 Type 2 diabetes mellitus with hypoglycemia without coma: Secondary | ICD-10-CM | POA: Diagnosis not present

## 2017-07-05 DIAGNOSIS — Z0000 Encounter for general adult medical examination without abnormal findings: Secondary | ICD-10-CM | POA: Diagnosis not present

## 2017-07-05 DIAGNOSIS — Z01118 Encounter for examination of ears and hearing with other abnormal findings: Secondary | ICD-10-CM | POA: Diagnosis not present

## 2017-07-05 DIAGNOSIS — I119 Hypertensive heart disease without heart failure: Secondary | ICD-10-CM | POA: Diagnosis not present

## 2017-07-05 DIAGNOSIS — H538 Other visual disturbances: Secondary | ICD-10-CM | POA: Diagnosis not present

## 2017-07-05 DIAGNOSIS — E785 Hyperlipidemia, unspecified: Secondary | ICD-10-CM | POA: Diagnosis not present

## 2017-07-05 DIAGNOSIS — N400 Enlarged prostate without lower urinary tract symptoms: Secondary | ICD-10-CM | POA: Diagnosis not present

## 2017-07-06 ENCOUNTER — Ambulatory Visit (INDEPENDENT_AMBULATORY_CARE_PROVIDER_SITE_OTHER): Payer: Medicare HMO

## 2017-07-06 DIAGNOSIS — J309 Allergic rhinitis, unspecified: Secondary | ICD-10-CM | POA: Diagnosis not present

## 2017-07-21 ENCOUNTER — Ambulatory Visit (INDEPENDENT_AMBULATORY_CARE_PROVIDER_SITE_OTHER): Payer: Medicare HMO

## 2017-07-21 DIAGNOSIS — J309 Allergic rhinitis, unspecified: Secondary | ICD-10-CM

## 2017-08-15 ENCOUNTER — Ambulatory Visit (INDEPENDENT_AMBULATORY_CARE_PROVIDER_SITE_OTHER): Payer: Medicare HMO

## 2017-08-15 DIAGNOSIS — J309 Allergic rhinitis, unspecified: Secondary | ICD-10-CM | POA: Diagnosis not present

## 2017-08-18 DIAGNOSIS — I119 Hypertensive heart disease without heart failure: Secondary | ICD-10-CM | POA: Diagnosis not present

## 2017-08-18 DIAGNOSIS — Z23 Encounter for immunization: Secondary | ICD-10-CM | POA: Diagnosis not present

## 2017-08-18 DIAGNOSIS — E11649 Type 2 diabetes mellitus with hypoglycemia without coma: Secondary | ICD-10-CM | POA: Diagnosis not present

## 2017-08-18 DIAGNOSIS — N400 Enlarged prostate without lower urinary tract symptoms: Secondary | ICD-10-CM | POA: Diagnosis not present

## 2017-08-18 DIAGNOSIS — E785 Hyperlipidemia, unspecified: Secondary | ICD-10-CM | POA: Diagnosis not present

## 2017-08-18 DIAGNOSIS — E559 Vitamin D deficiency, unspecified: Secondary | ICD-10-CM | POA: Diagnosis not present

## 2017-08-18 DIAGNOSIS — K219 Gastro-esophageal reflux disease without esophagitis: Secondary | ICD-10-CM | POA: Diagnosis not present

## 2017-08-18 DIAGNOSIS — I10 Essential (primary) hypertension: Secondary | ICD-10-CM | POA: Diagnosis not present

## 2017-09-19 ENCOUNTER — Ambulatory Visit (INDEPENDENT_AMBULATORY_CARE_PROVIDER_SITE_OTHER): Payer: Medicare HMO

## 2017-09-19 DIAGNOSIS — J309 Allergic rhinitis, unspecified: Secondary | ICD-10-CM | POA: Diagnosis not present

## 2017-10-11 ENCOUNTER — Ambulatory Visit (INDEPENDENT_AMBULATORY_CARE_PROVIDER_SITE_OTHER): Payer: Medicare HMO | Admitting: *Deleted

## 2017-10-11 DIAGNOSIS — J309 Allergic rhinitis, unspecified: Secondary | ICD-10-CM

## 2017-11-08 DIAGNOSIS — J3081 Allergic rhinitis due to animal (cat) (dog) hair and dander: Secondary | ICD-10-CM | POA: Diagnosis not present

## 2017-11-09 ENCOUNTER — Ambulatory Visit (INDEPENDENT_AMBULATORY_CARE_PROVIDER_SITE_OTHER): Payer: Medicare HMO

## 2017-11-09 DIAGNOSIS — J309 Allergic rhinitis, unspecified: Secondary | ICD-10-CM

## 2017-11-09 NOTE — Progress Notes (Signed)
VIALS EXP 08-31-19 

## 2017-11-10 DIAGNOSIS — J301 Allergic rhinitis due to pollen: Secondary | ICD-10-CM | POA: Diagnosis not present

## 2017-11-11 DIAGNOSIS — J3089 Other allergic rhinitis: Secondary | ICD-10-CM | POA: Diagnosis not present

## 2017-12-07 ENCOUNTER — Ambulatory Visit (INDEPENDENT_AMBULATORY_CARE_PROVIDER_SITE_OTHER): Payer: Medicare HMO | Admitting: *Deleted

## 2017-12-07 DIAGNOSIS — J309 Allergic rhinitis, unspecified: Secondary | ICD-10-CM

## 2017-12-08 DIAGNOSIS — K219 Gastro-esophageal reflux disease without esophagitis: Secondary | ICD-10-CM | POA: Diagnosis not present

## 2017-12-08 DIAGNOSIS — Z0000 Encounter for general adult medical examination without abnormal findings: Secondary | ICD-10-CM | POA: Diagnosis not present

## 2017-12-08 DIAGNOSIS — E559 Vitamin D deficiency, unspecified: Secondary | ICD-10-CM | POA: Diagnosis not present

## 2017-12-08 DIAGNOSIS — Z0110 Encounter for examination of ears and hearing without abnormal findings: Secondary | ICD-10-CM | POA: Diagnosis not present

## 2017-12-08 DIAGNOSIS — E785 Hyperlipidemia, unspecified: Secondary | ICD-10-CM | POA: Diagnosis not present

## 2017-12-08 DIAGNOSIS — E11649 Type 2 diabetes mellitus with hypoglycemia without coma: Secondary | ICD-10-CM | POA: Diagnosis not present

## 2017-12-08 DIAGNOSIS — Z125 Encounter for screening for malignant neoplasm of prostate: Secondary | ICD-10-CM | POA: Diagnosis not present

## 2017-12-08 DIAGNOSIS — N400 Enlarged prostate without lower urinary tract symptoms: Secondary | ICD-10-CM | POA: Diagnosis not present

## 2017-12-08 DIAGNOSIS — I119 Hypertensive heart disease without heart failure: Secondary | ICD-10-CM | POA: Diagnosis not present

## 2017-12-08 DIAGNOSIS — I10 Essential (primary) hypertension: Secondary | ICD-10-CM | POA: Diagnosis not present

## 2018-01-04 ENCOUNTER — Ambulatory Visit (INDEPENDENT_AMBULATORY_CARE_PROVIDER_SITE_OTHER): Payer: Medicare HMO

## 2018-01-04 DIAGNOSIS — J309 Allergic rhinitis, unspecified: Secondary | ICD-10-CM

## 2018-01-18 ENCOUNTER — Ambulatory Visit (INDEPENDENT_AMBULATORY_CARE_PROVIDER_SITE_OTHER): Payer: Medicare HMO | Admitting: *Deleted

## 2018-01-18 DIAGNOSIS — J309 Allergic rhinitis, unspecified: Secondary | ICD-10-CM | POA: Diagnosis not present

## 2018-02-02 ENCOUNTER — Ambulatory Visit (INDEPENDENT_AMBULATORY_CARE_PROVIDER_SITE_OTHER): Payer: Medicare HMO

## 2018-02-02 ENCOUNTER — Telehealth: Payer: Self-pay

## 2018-02-02 DIAGNOSIS — J309 Allergic rhinitis, unspecified: Secondary | ICD-10-CM | POA: Diagnosis not present

## 2018-02-02 NOTE — Telephone Encounter (Signed)
Pulled encounter up in error.

## 2018-02-15 ENCOUNTER — Ambulatory Visit (INDEPENDENT_AMBULATORY_CARE_PROVIDER_SITE_OTHER): Payer: Medicare HMO | Admitting: *Deleted

## 2018-02-15 DIAGNOSIS — J309 Allergic rhinitis, unspecified: Secondary | ICD-10-CM | POA: Diagnosis not present

## 2018-02-28 DIAGNOSIS — R972 Elevated prostate specific antigen [PSA]: Secondary | ICD-10-CM | POA: Diagnosis not present

## 2018-03-01 ENCOUNTER — Ambulatory Visit (INDEPENDENT_AMBULATORY_CARE_PROVIDER_SITE_OTHER): Payer: Medicare HMO | Admitting: *Deleted

## 2018-03-01 DIAGNOSIS — J309 Allergic rhinitis, unspecified: Secondary | ICD-10-CM | POA: Diagnosis not present

## 2018-03-21 DIAGNOSIS — I10 Essential (primary) hypertension: Secondary | ICD-10-CM | POA: Diagnosis not present

## 2018-03-21 DIAGNOSIS — E785 Hyperlipidemia, unspecified: Secondary | ICD-10-CM | POA: Diagnosis not present

## 2018-03-21 DIAGNOSIS — K219 Gastro-esophageal reflux disease without esophagitis: Secondary | ICD-10-CM | POA: Diagnosis not present

## 2018-03-21 DIAGNOSIS — E11649 Type 2 diabetes mellitus with hypoglycemia without coma: Secondary | ICD-10-CM | POA: Diagnosis not present

## 2018-03-21 DIAGNOSIS — I119 Hypertensive heart disease without heart failure: Secondary | ICD-10-CM | POA: Diagnosis not present

## 2018-03-21 DIAGNOSIS — E559 Vitamin D deficiency, unspecified: Secondary | ICD-10-CM | POA: Diagnosis not present

## 2018-03-29 ENCOUNTER — Ambulatory Visit (INDEPENDENT_AMBULATORY_CARE_PROVIDER_SITE_OTHER): Payer: Medicare HMO | Admitting: *Deleted

## 2018-03-29 DIAGNOSIS — J309 Allergic rhinitis, unspecified: Secondary | ICD-10-CM

## 2018-04-25 ENCOUNTER — Ambulatory Visit (INDEPENDENT_AMBULATORY_CARE_PROVIDER_SITE_OTHER): Payer: Medicare HMO | Admitting: *Deleted

## 2018-04-25 DIAGNOSIS — J309 Allergic rhinitis, unspecified: Secondary | ICD-10-CM

## 2018-05-01 DIAGNOSIS — N5201 Erectile dysfunction due to arterial insufficiency: Secondary | ICD-10-CM | POA: Diagnosis not present

## 2018-05-01 DIAGNOSIS — R972 Elevated prostate specific antigen [PSA]: Secondary | ICD-10-CM | POA: Diagnosis not present

## 2018-05-01 DIAGNOSIS — R350 Frequency of micturition: Secondary | ICD-10-CM | POA: Diagnosis not present

## 2018-05-22 ENCOUNTER — Ambulatory Visit (INDEPENDENT_AMBULATORY_CARE_PROVIDER_SITE_OTHER): Payer: Medicare HMO

## 2018-05-22 DIAGNOSIS — J309 Allergic rhinitis, unspecified: Secondary | ICD-10-CM

## 2018-06-15 ENCOUNTER — Encounter: Payer: Self-pay | Admitting: *Deleted

## 2018-06-15 NOTE — Progress Notes (Signed)
VIALS MADE

## 2018-06-16 DIAGNOSIS — J3081 Allergic rhinitis due to animal (cat) (dog) hair and dander: Secondary | ICD-10-CM | POA: Diagnosis not present

## 2018-06-19 DIAGNOSIS — E11649 Type 2 diabetes mellitus with hypoglycemia without coma: Secondary | ICD-10-CM | POA: Diagnosis not present

## 2018-06-19 DIAGNOSIS — I119 Hypertensive heart disease without heart failure: Secondary | ICD-10-CM | POA: Diagnosis not present

## 2018-06-19 DIAGNOSIS — I10 Essential (primary) hypertension: Secondary | ICD-10-CM | POA: Diagnosis not present

## 2018-06-19 DIAGNOSIS — E559 Vitamin D deficiency, unspecified: Secondary | ICD-10-CM | POA: Diagnosis not present

## 2018-06-19 DIAGNOSIS — K219 Gastro-esophageal reflux disease without esophagitis: Secondary | ICD-10-CM | POA: Diagnosis not present

## 2018-06-19 DIAGNOSIS — J3089 Other allergic rhinitis: Secondary | ICD-10-CM | POA: Diagnosis not present

## 2018-06-19 DIAGNOSIS — E785 Hyperlipidemia, unspecified: Secondary | ICD-10-CM | POA: Diagnosis not present

## 2018-06-22 ENCOUNTER — Ambulatory Visit (INDEPENDENT_AMBULATORY_CARE_PROVIDER_SITE_OTHER): Payer: Medicare HMO

## 2018-06-22 DIAGNOSIS — J309 Allergic rhinitis, unspecified: Secondary | ICD-10-CM | POA: Diagnosis not present

## 2018-06-29 DIAGNOSIS — J301 Allergic rhinitis due to pollen: Secondary | ICD-10-CM | POA: Diagnosis not present

## 2018-06-29 NOTE — Progress Notes (Signed)
VIAL (G-T) EXP 06-17-19

## 2018-07-06 DIAGNOSIS — I119 Hypertensive heart disease without heart failure: Secondary | ICD-10-CM | POA: Diagnosis not present

## 2018-07-06 DIAGNOSIS — Z0000 Encounter for general adult medical examination without abnormal findings: Secondary | ICD-10-CM | POA: Diagnosis not present

## 2018-07-06 DIAGNOSIS — I10 Essential (primary) hypertension: Secondary | ICD-10-CM | POA: Diagnosis not present

## 2018-07-06 DIAGNOSIS — K219 Gastro-esophageal reflux disease without esophagitis: Secondary | ICD-10-CM | POA: Diagnosis not present

## 2018-07-06 DIAGNOSIS — E785 Hyperlipidemia, unspecified: Secondary | ICD-10-CM | POA: Diagnosis not present

## 2018-07-06 DIAGNOSIS — E559 Vitamin D deficiency, unspecified: Secondary | ICD-10-CM | POA: Diagnosis not present

## 2018-07-06 DIAGNOSIS — E11649 Type 2 diabetes mellitus with hypoglycemia without coma: Secondary | ICD-10-CM | POA: Diagnosis not present

## 2018-07-10 DIAGNOSIS — N390 Urinary tract infection, site not specified: Secondary | ICD-10-CM | POA: Diagnosis not present

## 2018-07-10 DIAGNOSIS — N183 Chronic kidney disease, stage 3 (moderate): Secondary | ICD-10-CM | POA: Diagnosis not present

## 2018-07-10 DIAGNOSIS — E559 Vitamin D deficiency, unspecified: Secondary | ICD-10-CM | POA: Diagnosis not present

## 2018-07-10 DIAGNOSIS — I10 Essential (primary) hypertension: Secondary | ICD-10-CM | POA: Diagnosis not present

## 2018-07-11 DIAGNOSIS — R809 Proteinuria, unspecified: Secondary | ICD-10-CM | POA: Diagnosis not present

## 2018-07-17 DIAGNOSIS — E11649 Type 2 diabetes mellitus with hypoglycemia without coma: Secondary | ICD-10-CM | POA: Diagnosis not present

## 2018-07-17 DIAGNOSIS — Z23 Encounter for immunization: Secondary | ICD-10-CM | POA: Diagnosis not present

## 2018-07-17 DIAGNOSIS — E785 Hyperlipidemia, unspecified: Secondary | ICD-10-CM | POA: Diagnosis not present

## 2018-07-17 DIAGNOSIS — I119 Hypertensive heart disease without heart failure: Secondary | ICD-10-CM | POA: Diagnosis not present

## 2018-07-17 DIAGNOSIS — E559 Vitamin D deficiency, unspecified: Secondary | ICD-10-CM | POA: Diagnosis not present

## 2018-07-17 DIAGNOSIS — I10 Essential (primary) hypertension: Secondary | ICD-10-CM | POA: Diagnosis not present

## 2018-07-24 ENCOUNTER — Ambulatory Visit (INDEPENDENT_AMBULATORY_CARE_PROVIDER_SITE_OTHER): Payer: Medicare HMO | Admitting: *Deleted

## 2018-07-24 DIAGNOSIS — J309 Allergic rhinitis, unspecified: Secondary | ICD-10-CM

## 2018-07-31 DIAGNOSIS — N183 Chronic kidney disease, stage 3 (moderate): Secondary | ICD-10-CM | POA: Diagnosis not present

## 2018-07-31 DIAGNOSIS — I10 Essential (primary) hypertension: Secondary | ICD-10-CM | POA: Diagnosis not present

## 2018-07-31 DIAGNOSIS — E559 Vitamin D deficiency, unspecified: Secondary | ICD-10-CM | POA: Diagnosis not present

## 2018-08-09 DIAGNOSIS — N281 Cyst of kidney, acquired: Secondary | ICD-10-CM | POA: Diagnosis not present

## 2018-08-09 DIAGNOSIS — N400 Enlarged prostate without lower urinary tract symptoms: Secondary | ICD-10-CM | POA: Diagnosis not present

## 2018-08-09 DIAGNOSIS — N189 Chronic kidney disease, unspecified: Secondary | ICD-10-CM | POA: Diagnosis not present

## 2018-08-09 DIAGNOSIS — N183 Chronic kidney disease, stage 3 (moderate): Secondary | ICD-10-CM | POA: Diagnosis not present

## 2018-08-15 ENCOUNTER — Ambulatory Visit (INDEPENDENT_AMBULATORY_CARE_PROVIDER_SITE_OTHER): Payer: Medicare HMO

## 2018-08-15 DIAGNOSIS — J309 Allergic rhinitis, unspecified: Secondary | ICD-10-CM | POA: Diagnosis not present

## 2018-08-18 DIAGNOSIS — I10 Essential (primary) hypertension: Secondary | ICD-10-CM | POA: Diagnosis not present

## 2018-08-18 DIAGNOSIS — N183 Chronic kidney disease, stage 3 (moderate): Secondary | ICD-10-CM | POA: Diagnosis not present

## 2018-08-18 DIAGNOSIS — E559 Vitamin D deficiency, unspecified: Secondary | ICD-10-CM | POA: Diagnosis not present

## 2018-08-21 DIAGNOSIS — N183 Chronic kidney disease, stage 3 (moderate): Secondary | ICD-10-CM | POA: Diagnosis not present

## 2018-08-21 DIAGNOSIS — N390 Urinary tract infection, site not specified: Secondary | ICD-10-CM | POA: Diagnosis not present

## 2018-08-21 DIAGNOSIS — I10 Essential (primary) hypertension: Secondary | ICD-10-CM | POA: Diagnosis not present

## 2018-08-21 DIAGNOSIS — E559 Vitamin D deficiency, unspecified: Secondary | ICD-10-CM | POA: Diagnosis not present

## 2018-08-30 ENCOUNTER — Ambulatory Visit (INDEPENDENT_AMBULATORY_CARE_PROVIDER_SITE_OTHER): Payer: Medicare HMO

## 2018-08-30 DIAGNOSIS — J309 Allergic rhinitis, unspecified: Secondary | ICD-10-CM | POA: Diagnosis not present

## 2018-09-11 DIAGNOSIS — E559 Vitamin D deficiency, unspecified: Secondary | ICD-10-CM | POA: Diagnosis not present

## 2018-09-11 DIAGNOSIS — I119 Hypertensive heart disease without heart failure: Secondary | ICD-10-CM | POA: Diagnosis not present

## 2018-09-11 DIAGNOSIS — E7800 Pure hypercholesterolemia, unspecified: Secondary | ICD-10-CM | POA: Diagnosis not present

## 2018-09-11 DIAGNOSIS — I10 Essential (primary) hypertension: Secondary | ICD-10-CM | POA: Diagnosis not present

## 2018-09-11 DIAGNOSIS — E11649 Type 2 diabetes mellitus with hypoglycemia without coma: Secondary | ICD-10-CM | POA: Diagnosis not present

## 2018-09-18 ENCOUNTER — Ambulatory Visit (INDEPENDENT_AMBULATORY_CARE_PROVIDER_SITE_OTHER): Payer: Medicare HMO | Admitting: *Deleted

## 2018-09-18 DIAGNOSIS — J309 Allergic rhinitis, unspecified: Secondary | ICD-10-CM

## 2018-09-27 ENCOUNTER — Ambulatory Visit (INDEPENDENT_AMBULATORY_CARE_PROVIDER_SITE_OTHER): Payer: Medicare HMO | Admitting: *Deleted

## 2018-09-27 DIAGNOSIS — J309 Allergic rhinitis, unspecified: Secondary | ICD-10-CM | POA: Diagnosis not present

## 2018-09-29 DIAGNOSIS — E559 Vitamin D deficiency, unspecified: Secondary | ICD-10-CM | POA: Diagnosis not present

## 2018-09-29 DIAGNOSIS — N183 Chronic kidney disease, stage 3 (moderate): Secondary | ICD-10-CM | POA: Diagnosis not present

## 2018-09-29 DIAGNOSIS — I10 Essential (primary) hypertension: Secondary | ICD-10-CM | POA: Diagnosis not present

## 2018-10-02 DIAGNOSIS — N390 Urinary tract infection, site not specified: Secondary | ICD-10-CM | POA: Diagnosis not present

## 2018-10-02 DIAGNOSIS — E7800 Pure hypercholesterolemia, unspecified: Secondary | ICD-10-CM | POA: Diagnosis not present

## 2018-10-02 DIAGNOSIS — E559 Vitamin D deficiency, unspecified: Secondary | ICD-10-CM | POA: Diagnosis not present

## 2018-10-02 DIAGNOSIS — J0111 Acute recurrent frontal sinusitis: Secondary | ICD-10-CM | POA: Diagnosis not present

## 2018-10-02 DIAGNOSIS — I10 Essential (primary) hypertension: Secondary | ICD-10-CM | POA: Diagnosis not present

## 2018-10-02 DIAGNOSIS — N183 Chronic kidney disease, stage 3 (moderate): Secondary | ICD-10-CM | POA: Diagnosis not present

## 2018-10-02 DIAGNOSIS — E11649 Type 2 diabetes mellitus with hypoglycemia without coma: Secondary | ICD-10-CM | POA: Diagnosis not present

## 2018-10-02 DIAGNOSIS — I119 Hypertensive heart disease without heart failure: Secondary | ICD-10-CM | POA: Diagnosis not present

## 2018-10-02 DIAGNOSIS — R1013 Epigastric pain: Secondary | ICD-10-CM | POA: Diagnosis not present

## 2018-10-11 ENCOUNTER — Ambulatory Visit (INDEPENDENT_AMBULATORY_CARE_PROVIDER_SITE_OTHER): Payer: Medicare HMO | Admitting: *Deleted

## 2018-10-11 DIAGNOSIS — J309 Allergic rhinitis, unspecified: Secondary | ICD-10-CM

## 2018-10-26 ENCOUNTER — Ambulatory Visit (INDEPENDENT_AMBULATORY_CARE_PROVIDER_SITE_OTHER): Payer: Medicare HMO

## 2018-10-26 DIAGNOSIS — J309 Allergic rhinitis, unspecified: Secondary | ICD-10-CM

## 2018-11-08 ENCOUNTER — Ambulatory Visit: Payer: Self-pay

## 2018-11-08 DIAGNOSIS — J309 Allergic rhinitis, unspecified: Secondary | ICD-10-CM

## 2018-12-06 ENCOUNTER — Ambulatory Visit (INDEPENDENT_AMBULATORY_CARE_PROVIDER_SITE_OTHER): Payer: Medicare HMO | Admitting: *Deleted

## 2018-12-06 DIAGNOSIS — J309 Allergic rhinitis, unspecified: Secondary | ICD-10-CM

## 2019-01-03 ENCOUNTER — Ambulatory Visit (INDEPENDENT_AMBULATORY_CARE_PROVIDER_SITE_OTHER): Payer: Medicare HMO

## 2019-01-03 DIAGNOSIS — J309 Allergic rhinitis, unspecified: Secondary | ICD-10-CM

## 2019-01-10 DIAGNOSIS — J301 Allergic rhinitis due to pollen: Secondary | ICD-10-CM | POA: Diagnosis not present

## 2019-01-11 DIAGNOSIS — J3081 Allergic rhinitis due to animal (cat) (dog) hair and dander: Secondary | ICD-10-CM | POA: Diagnosis not present

## 2019-01-11 NOTE — Progress Notes (Signed)
Vials Exp 01-11-2020 AH

## 2019-01-17 DIAGNOSIS — J3089 Other allergic rhinitis: Secondary | ICD-10-CM | POA: Diagnosis not present

## 2019-01-29 DIAGNOSIS — E559 Vitamin D deficiency, unspecified: Secondary | ICD-10-CM | POA: Diagnosis not present

## 2019-01-29 DIAGNOSIS — E7800 Pure hypercholesterolemia, unspecified: Secondary | ICD-10-CM | POA: Diagnosis not present

## 2019-01-29 DIAGNOSIS — I10 Essential (primary) hypertension: Secondary | ICD-10-CM | POA: Diagnosis not present

## 2019-01-29 DIAGNOSIS — I119 Hypertensive heart disease without heart failure: Secondary | ICD-10-CM | POA: Diagnosis not present

## 2019-01-29 DIAGNOSIS — E11649 Type 2 diabetes mellitus with hypoglycemia without coma: Secondary | ICD-10-CM | POA: Diagnosis not present

## 2019-01-31 ENCOUNTER — Ambulatory Visit (INDEPENDENT_AMBULATORY_CARE_PROVIDER_SITE_OTHER): Payer: Medicare HMO

## 2019-01-31 DIAGNOSIS — J309 Allergic rhinitis, unspecified: Secondary | ICD-10-CM

## 2019-02-28 ENCOUNTER — Ambulatory Visit (INDEPENDENT_AMBULATORY_CARE_PROVIDER_SITE_OTHER): Payer: Medicare HMO

## 2019-02-28 DIAGNOSIS — J309 Allergic rhinitis, unspecified: Secondary | ICD-10-CM

## 2019-03-28 ENCOUNTER — Ambulatory Visit (INDEPENDENT_AMBULATORY_CARE_PROVIDER_SITE_OTHER): Payer: Medicare HMO

## 2019-03-28 DIAGNOSIS — J309 Allergic rhinitis, unspecified: Secondary | ICD-10-CM | POA: Diagnosis not present

## 2019-04-02 DIAGNOSIS — N182 Chronic kidney disease, stage 2 (mild): Secondary | ICD-10-CM | POA: Diagnosis not present

## 2019-04-02 DIAGNOSIS — E559 Vitamin D deficiency, unspecified: Secondary | ICD-10-CM | POA: Diagnosis not present

## 2019-04-02 DIAGNOSIS — N183 Chronic kidney disease, stage 3 (moderate): Secondary | ICD-10-CM | POA: Diagnosis not present

## 2019-04-02 DIAGNOSIS — I10 Essential (primary) hypertension: Secondary | ICD-10-CM | POA: Diagnosis not present

## 2019-04-03 DIAGNOSIS — N183 Chronic kidney disease, stage 3 (moderate): Secondary | ICD-10-CM | POA: Diagnosis not present

## 2019-04-03 DIAGNOSIS — N390 Urinary tract infection, site not specified: Secondary | ICD-10-CM | POA: Diagnosis not present

## 2019-04-03 DIAGNOSIS — I10 Essential (primary) hypertension: Secondary | ICD-10-CM | POA: Diagnosis not present

## 2019-04-03 DIAGNOSIS — E559 Vitamin D deficiency, unspecified: Secondary | ICD-10-CM | POA: Diagnosis not present

## 2019-04-11 ENCOUNTER — Ambulatory Visit (INDEPENDENT_AMBULATORY_CARE_PROVIDER_SITE_OTHER): Payer: Medicare HMO

## 2019-04-11 DIAGNOSIS — J309 Allergic rhinitis, unspecified: Secondary | ICD-10-CM

## 2019-04-16 DIAGNOSIS — I10 Essential (primary) hypertension: Secondary | ICD-10-CM | POA: Diagnosis not present

## 2019-04-16 DIAGNOSIS — E11649 Type 2 diabetes mellitus with hypoglycemia without coma: Secondary | ICD-10-CM | POA: Diagnosis not present

## 2019-04-16 DIAGNOSIS — E559 Vitamin D deficiency, unspecified: Secondary | ICD-10-CM | POA: Diagnosis not present

## 2019-04-16 DIAGNOSIS — E7800 Pure hypercholesterolemia, unspecified: Secondary | ICD-10-CM | POA: Diagnosis not present

## 2019-04-16 DIAGNOSIS — Z0001 Encounter for general adult medical examination with abnormal findings: Secondary | ICD-10-CM | POA: Diagnosis not present

## 2019-04-16 DIAGNOSIS — I119 Hypertensive heart disease without heart failure: Secondary | ICD-10-CM | POA: Diagnosis not present

## 2019-04-16 DIAGNOSIS — Z1329 Encounter for screening for other suspected endocrine disorder: Secondary | ICD-10-CM | POA: Diagnosis not present

## 2019-04-16 DIAGNOSIS — Z131 Encounter for screening for diabetes mellitus: Secondary | ICD-10-CM | POA: Diagnosis not present

## 2019-04-25 ENCOUNTER — Ambulatory Visit (INDEPENDENT_AMBULATORY_CARE_PROVIDER_SITE_OTHER): Payer: Medicare HMO | Admitting: *Deleted

## 2019-04-25 DIAGNOSIS — J309 Allergic rhinitis, unspecified: Secondary | ICD-10-CM

## 2019-04-26 DIAGNOSIS — R972 Elevated prostate specific antigen [PSA]: Secondary | ICD-10-CM | POA: Diagnosis not present

## 2019-05-09 ENCOUNTER — Ambulatory Visit (INDEPENDENT_AMBULATORY_CARE_PROVIDER_SITE_OTHER): Payer: Medicare HMO

## 2019-05-09 DIAGNOSIS — J309 Allergic rhinitis, unspecified: Secondary | ICD-10-CM | POA: Diagnosis not present

## 2019-05-23 ENCOUNTER — Ambulatory Visit (INDEPENDENT_AMBULATORY_CARE_PROVIDER_SITE_OTHER): Payer: Medicare HMO

## 2019-05-23 DIAGNOSIS — J309 Allergic rhinitis, unspecified: Secondary | ICD-10-CM

## 2019-06-07 DIAGNOSIS — N5201 Erectile dysfunction due to arterial insufficiency: Secondary | ICD-10-CM | POA: Diagnosis not present

## 2019-06-07 DIAGNOSIS — R350 Frequency of micturition: Secondary | ICD-10-CM | POA: Diagnosis not present

## 2019-06-07 DIAGNOSIS — R972 Elevated prostate specific antigen [PSA]: Secondary | ICD-10-CM | POA: Diagnosis not present

## 2019-06-19 ENCOUNTER — Ambulatory Visit (INDEPENDENT_AMBULATORY_CARE_PROVIDER_SITE_OTHER): Payer: Medicare HMO

## 2019-06-19 DIAGNOSIS — J309 Allergic rhinitis, unspecified: Secondary | ICD-10-CM | POA: Diagnosis not present

## 2019-07-06 DIAGNOSIS — E7800 Pure hypercholesterolemia, unspecified: Secondary | ICD-10-CM | POA: Diagnosis not present

## 2019-07-06 DIAGNOSIS — Z0001 Encounter for general adult medical examination with abnormal findings: Secondary | ICD-10-CM | POA: Diagnosis not present

## 2019-07-06 DIAGNOSIS — E11649 Type 2 diabetes mellitus with hypoglycemia without coma: Secondary | ICD-10-CM | POA: Diagnosis not present

## 2019-07-06 DIAGNOSIS — I119 Hypertensive heart disease without heart failure: Secondary | ICD-10-CM | POA: Diagnosis not present

## 2019-07-06 DIAGNOSIS — Z1389 Encounter for screening for other disorder: Secondary | ICD-10-CM | POA: Diagnosis not present

## 2019-07-06 DIAGNOSIS — E559 Vitamin D deficiency, unspecified: Secondary | ICD-10-CM | POA: Diagnosis not present

## 2019-07-18 ENCOUNTER — Ambulatory Visit (INDEPENDENT_AMBULATORY_CARE_PROVIDER_SITE_OTHER): Payer: Medicare HMO

## 2019-07-18 DIAGNOSIS — J309 Allergic rhinitis, unspecified: Secondary | ICD-10-CM

## 2019-07-26 NOTE — Progress Notes (Signed)
VIALS EXP 07-25-20

## 2019-07-30 DIAGNOSIS — J3081 Allergic rhinitis due to animal (cat) (dog) hair and dander: Secondary | ICD-10-CM

## 2019-07-31 DIAGNOSIS — J3089 Other allergic rhinitis: Secondary | ICD-10-CM | POA: Diagnosis not present

## 2019-08-01 DIAGNOSIS — J301 Allergic rhinitis due to pollen: Secondary | ICD-10-CM | POA: Diagnosis not present

## 2019-08-07 ENCOUNTER — Other Ambulatory Visit: Payer: Self-pay

## 2019-08-07 ENCOUNTER — Encounter: Payer: Self-pay | Admitting: Pediatrics

## 2019-08-07 ENCOUNTER — Ambulatory Visit (INDEPENDENT_AMBULATORY_CARE_PROVIDER_SITE_OTHER): Payer: Medicare HMO | Admitting: Pediatrics

## 2019-08-07 VITALS — BP 130/60 | HR 72 | Temp 98.1°F | Resp 12 | Ht 68.0 in | Wt 180.3 lb

## 2019-08-07 DIAGNOSIS — I10 Essential (primary) hypertension: Secondary | ICD-10-CM

## 2019-08-07 DIAGNOSIS — J301 Allergic rhinitis due to pollen: Secondary | ICD-10-CM

## 2019-08-07 DIAGNOSIS — J309 Allergic rhinitis, unspecified: Secondary | ICD-10-CM

## 2019-08-07 DIAGNOSIS — K219 Gastro-esophageal reflux disease without esophagitis: Secondary | ICD-10-CM | POA: Diagnosis not present

## 2019-08-07 NOTE — Progress Notes (Signed)
  [address] [place] [address] Dept: [phone]  FOLLOW UP NOTE  Patient ID: Javier Ross, male    DOB: Sep 12, 1935  Age: 84 y.o. MRN: [id number] Date of Office Visit: 08/07/2019  Assessment  Chief Complaint: Allergic Rhinitis  (doing well)  HPI Javier Ross presents for follow-up of allergic rhinitis.  He is on allergy injections every 4 weeks and feels that they have helped him dramatically.  He does not have to take medication in the springtime for allergic rhinitis.  He would like to continue on his allergy injections.  His blood pressure is well controlled.  His gastroesophageal reflux is well controlled   Drug Allergies:  No Known Allergies  Physical Exam: BP 130/60 (BP Location: Left Arm, Patient Position: Sitting, Cuff Size: Normal)   Pulse 72   Temp 98.1 F (36.7 C) (Oral)   Resp 12   Ht 5\' 8"  (1.727 m)   Wt 180 lb 5.4 oz (81.8 kg)   SpO2 99%   BMI 27.42 kg/m    Physical Exam Vitals signs reviewed.  Constitutional:      Appearance: Normal appearance. He is normal weight.  HENT:     Head:     Comments: Eyes normal.  Ears normal.  Nose normal.  Pharynx normal Neck:     Musculoskeletal: Neck supple.  Cardiovascular:     Rate and Rhythm: Normal rate and regular rhythm.     Comments: S1-S2 normal no murmurs Pulmonary:     Comments: Clear to percussion and auscultation Lymphadenopathy:     Cervical: No cervical adenopathy.  Neurological:     General: No focal deficit present.     Mental Status: He is alert and oriented to person, place, and time. Mental status is at baseline.  Psychiatric:        Mood and Affect: Mood normal.        Behavior: Behavior normal.        Thought Content: Thought content normal.        Judgment: Judgment normal.     Diagnostics:    Assessment and Plan: 1. Seasonal allergic rhinitis due to pollen   2. Essential hypertension   3. Gastroesophageal reflux disease without esophagitis     No orders of  the defined types were placed in this encounter.   Patient Instructions  Continue on allergy injections every 4 weeks Claritin 10 mg-take 1 tablet once a day if needed for runny nose or itchy eyes Nasacort 1 spray per nostril  twice a day if needed for stuffy nose Call us if you are not doing well on this treatment plan Continue on your other medications   Return in about 1 year (around 08/06/2020).    Thank you for the opportunity to care for this patient.  Please do not hesitate to contact me with questions.  Penne Lash, M.D.  [facility] [address] [place], [place] [address] [phone]

## 2019-08-07 NOTE — Patient Instructions (Signed)
Continue on allergy injections every 4 weeks Claritin 10 mg-take 1 tablet once a day if needed for runny nose or itchy eyes Nasacort 1 spray per nostril  twice a day if needed for stuffy nose Call us if you are not doing well on this treatment plan Continue on your other medications

## 2019-08-10 DIAGNOSIS — E11649 Type 2 diabetes mellitus with hypoglycemia without coma: Secondary | ICD-10-CM | POA: Diagnosis not present

## 2019-08-10 DIAGNOSIS — E7800 Pure hypercholesterolemia, unspecified: Secondary | ICD-10-CM | POA: Diagnosis not present

## 2019-08-10 DIAGNOSIS — E559 Vitamin D deficiency, unspecified: Secondary | ICD-10-CM | POA: Diagnosis not present

## 2019-08-10 DIAGNOSIS — I119 Hypertensive heart disease without heart failure: Secondary | ICD-10-CM | POA: Diagnosis not present

## 2019-09-06 ENCOUNTER — Ambulatory Visit (INDEPENDENT_AMBULATORY_CARE_PROVIDER_SITE_OTHER): Payer: Medicare HMO

## 2019-09-06 DIAGNOSIS — J309 Allergic rhinitis, unspecified: Secondary | ICD-10-CM

## 2019-10-01 DIAGNOSIS — I10 Essential (primary) hypertension: Secondary | ICD-10-CM | POA: Diagnosis not present

## 2019-10-01 DIAGNOSIS — N183 Chronic kidney disease, stage 3 unspecified: Secondary | ICD-10-CM | POA: Diagnosis not present

## 2019-10-01 DIAGNOSIS — E559 Vitamin D deficiency, unspecified: Secondary | ICD-10-CM | POA: Diagnosis not present

## 2019-10-02 ENCOUNTER — Ambulatory Visit (INDEPENDENT_AMBULATORY_CARE_PROVIDER_SITE_OTHER): Payer: Medicare HMO

## 2019-10-02 DIAGNOSIS — J309 Allergic rhinitis, unspecified: Secondary | ICD-10-CM | POA: Diagnosis not present

## 2019-10-03 DIAGNOSIS — N183 Chronic kidney disease, stage 3 unspecified: Secondary | ICD-10-CM | POA: Diagnosis not present

## 2019-10-03 DIAGNOSIS — N390 Urinary tract infection, site not specified: Secondary | ICD-10-CM | POA: Diagnosis not present

## 2019-10-03 DIAGNOSIS — I10 Essential (primary) hypertension: Secondary | ICD-10-CM | POA: Diagnosis not present

## 2019-10-03 DIAGNOSIS — E559 Vitamin D deficiency, unspecified: Secondary | ICD-10-CM | POA: Diagnosis not present

## 2019-11-01 ENCOUNTER — Ambulatory Visit (INDEPENDENT_AMBULATORY_CARE_PROVIDER_SITE_OTHER): Payer: Medicare HMO

## 2019-11-01 DIAGNOSIS — J309 Allergic rhinitis, unspecified: Secondary | ICD-10-CM | POA: Diagnosis not present

## 2019-11-14 DIAGNOSIS — E11649 Type 2 diabetes mellitus with hypoglycemia without coma: Secondary | ICD-10-CM | POA: Diagnosis not present

## 2019-11-14 DIAGNOSIS — E7800 Pure hypercholesterolemia, unspecified: Secondary | ICD-10-CM | POA: Diagnosis not present

## 2019-11-14 DIAGNOSIS — I119 Hypertensive heart disease without heart failure: Secondary | ICD-10-CM | POA: Diagnosis not present

## 2019-11-14 DIAGNOSIS — E559 Vitamin D deficiency, unspecified: Secondary | ICD-10-CM | POA: Diagnosis not present

## 2019-11-21 DIAGNOSIS — N179 Acute kidney failure, unspecified: Secondary | ICD-10-CM | POA: Diagnosis not present

## 2019-11-21 DIAGNOSIS — E11649 Type 2 diabetes mellitus with hypoglycemia without coma: Secondary | ICD-10-CM | POA: Diagnosis not present

## 2019-11-21 DIAGNOSIS — E7800 Pure hypercholesterolemia, unspecified: Secondary | ICD-10-CM | POA: Diagnosis not present

## 2019-11-21 DIAGNOSIS — E559 Vitamin D deficiency, unspecified: Secondary | ICD-10-CM | POA: Diagnosis not present

## 2019-11-21 DIAGNOSIS — I119 Hypertensive heart disease without heart failure: Secondary | ICD-10-CM | POA: Diagnosis not present

## 2019-11-28 ENCOUNTER — Ambulatory Visit (INDEPENDENT_AMBULATORY_CARE_PROVIDER_SITE_OTHER): Payer: Medicare HMO

## 2019-11-28 DIAGNOSIS — J309 Allergic rhinitis, unspecified: Secondary | ICD-10-CM

## 2019-12-12 DIAGNOSIS — E11649 Type 2 diabetes mellitus with hypoglycemia without coma: Secondary | ICD-10-CM | POA: Diagnosis not present

## 2019-12-12 DIAGNOSIS — E876 Hypokalemia: Secondary | ICD-10-CM | POA: Diagnosis not present

## 2019-12-12 DIAGNOSIS — N182 Chronic kidney disease, stage 2 (mild): Secondary | ICD-10-CM | POA: Diagnosis not present

## 2019-12-12 DIAGNOSIS — I119 Hypertensive heart disease without heart failure: Secondary | ICD-10-CM | POA: Diagnosis not present

## 2019-12-12 DIAGNOSIS — E7800 Pure hypercholesterolemia, unspecified: Secondary | ICD-10-CM | POA: Diagnosis not present

## 2019-12-12 DIAGNOSIS — E559 Vitamin D deficiency, unspecified: Secondary | ICD-10-CM | POA: Diagnosis not present

## 2019-12-27 ENCOUNTER — Ambulatory Visit (INDEPENDENT_AMBULATORY_CARE_PROVIDER_SITE_OTHER): Payer: Medicare HMO

## 2019-12-27 DIAGNOSIS — J309 Allergic rhinitis, unspecified: Secondary | ICD-10-CM

## 2020-01-09 ENCOUNTER — Ambulatory Visit (INDEPENDENT_AMBULATORY_CARE_PROVIDER_SITE_OTHER): Payer: Medicare HMO

## 2020-01-09 DIAGNOSIS — E7800 Pure hypercholesterolemia, unspecified: Secondary | ICD-10-CM | POA: Diagnosis not present

## 2020-01-09 DIAGNOSIS — I119 Hypertensive heart disease without heart failure: Secondary | ICD-10-CM | POA: Diagnosis not present

## 2020-01-09 DIAGNOSIS — E11649 Type 2 diabetes mellitus with hypoglycemia without coma: Secondary | ICD-10-CM | POA: Diagnosis not present

## 2020-01-09 DIAGNOSIS — N182 Chronic kidney disease, stage 2 (mild): Secondary | ICD-10-CM | POA: Diagnosis not present

## 2020-01-09 DIAGNOSIS — J309 Allergic rhinitis, unspecified: Secondary | ICD-10-CM

## 2020-01-09 DIAGNOSIS — E559 Vitamin D deficiency, unspecified: Secondary | ICD-10-CM | POA: Diagnosis not present

## 2020-01-18 DIAGNOSIS — K529 Noninfective gastroenteritis and colitis, unspecified: Secondary | ICD-10-CM | POA: Diagnosis not present

## 2020-01-18 DIAGNOSIS — R21 Rash and other nonspecific skin eruption: Secondary | ICD-10-CM | POA: Diagnosis not present

## 2020-01-18 DIAGNOSIS — K219 Gastro-esophageal reflux disease without esophagitis: Secondary | ICD-10-CM | POA: Diagnosis not present

## 2020-01-23 ENCOUNTER — Ambulatory Visit (INDEPENDENT_AMBULATORY_CARE_PROVIDER_SITE_OTHER): Payer: Medicare HMO | Admitting: *Deleted

## 2020-01-23 DIAGNOSIS — J309 Allergic rhinitis, unspecified: Secondary | ICD-10-CM

## 2020-02-07 ENCOUNTER — Ambulatory Visit (INDEPENDENT_AMBULATORY_CARE_PROVIDER_SITE_OTHER): Payer: Medicare HMO

## 2020-02-07 DIAGNOSIS — J309 Allergic rhinitis, unspecified: Secondary | ICD-10-CM

## 2020-02-18 DIAGNOSIS — Z1283 Encounter for screening for malignant neoplasm of skin: Secondary | ICD-10-CM | POA: Diagnosis not present

## 2020-02-18 DIAGNOSIS — L810 Postinflammatory hyperpigmentation: Secondary | ICD-10-CM | POA: Diagnosis not present

## 2020-02-20 ENCOUNTER — Ambulatory Visit (INDEPENDENT_AMBULATORY_CARE_PROVIDER_SITE_OTHER): Payer: Medicare HMO | Admitting: *Deleted

## 2020-02-20 DIAGNOSIS — J309 Allergic rhinitis, unspecified: Secondary | ICD-10-CM

## 2020-02-20 DIAGNOSIS — K529 Noninfective gastroenteritis and colitis, unspecified: Secondary | ICD-10-CM | POA: Diagnosis not present

## 2020-02-20 DIAGNOSIS — K219 Gastro-esophageal reflux disease without esophagitis: Secondary | ICD-10-CM | POA: Diagnosis not present

## 2020-02-20 DIAGNOSIS — R21 Rash and other nonspecific skin eruption: Secondary | ICD-10-CM | POA: Diagnosis not present

## 2020-03-19 ENCOUNTER — Ambulatory Visit (INDEPENDENT_AMBULATORY_CARE_PROVIDER_SITE_OTHER): Payer: Medicare HMO | Admitting: *Deleted

## 2020-03-19 DIAGNOSIS — J309 Allergic rhinitis, unspecified: Secondary | ICD-10-CM | POA: Diagnosis not present

## 2020-04-02 DIAGNOSIS — N183 Chronic kidney disease, stage 3 unspecified: Secondary | ICD-10-CM | POA: Diagnosis not present

## 2020-04-02 DIAGNOSIS — I10 Essential (primary) hypertension: Secondary | ICD-10-CM | POA: Diagnosis not present

## 2020-04-03 DIAGNOSIS — E559 Vitamin D deficiency, unspecified: Secondary | ICD-10-CM | POA: Diagnosis not present

## 2020-04-03 DIAGNOSIS — N183 Chronic kidney disease, stage 3 unspecified: Secondary | ICD-10-CM | POA: Diagnosis not present

## 2020-04-03 DIAGNOSIS — I10 Essential (primary) hypertension: Secondary | ICD-10-CM | POA: Diagnosis not present

## 2020-04-03 DIAGNOSIS — N390 Urinary tract infection, site not specified: Secondary | ICD-10-CM | POA: Diagnosis not present

## 2020-04-14 DIAGNOSIS — Z136 Encounter for screening for cardiovascular disorders: Secondary | ICD-10-CM | POA: Diagnosis not present

## 2020-04-14 DIAGNOSIS — E7800 Pure hypercholesterolemia, unspecified: Secondary | ICD-10-CM | POA: Diagnosis not present

## 2020-04-14 DIAGNOSIS — I119 Hypertensive heart disease without heart failure: Secondary | ICD-10-CM | POA: Diagnosis not present

## 2020-04-14 DIAGNOSIS — Z01118 Encounter for examination of ears and hearing with other abnormal findings: Secondary | ICD-10-CM | POA: Diagnosis not present

## 2020-04-14 DIAGNOSIS — N182 Chronic kidney disease, stage 2 (mild): Secondary | ICD-10-CM | POA: Diagnosis not present

## 2020-04-14 DIAGNOSIS — Z1329 Encounter for screening for other suspected endocrine disorder: Secondary | ICD-10-CM | POA: Diagnosis not present

## 2020-04-14 DIAGNOSIS — E11649 Type 2 diabetes mellitus with hypoglycemia without coma: Secondary | ICD-10-CM | POA: Diagnosis not present

## 2020-04-14 DIAGNOSIS — Z0001 Encounter for general adult medical examination with abnormal findings: Secondary | ICD-10-CM | POA: Diagnosis not present

## 2020-04-14 DIAGNOSIS — E559 Vitamin D deficiency, unspecified: Secondary | ICD-10-CM | POA: Diagnosis not present

## 2020-04-16 ENCOUNTER — Ambulatory Visit (INDEPENDENT_AMBULATORY_CARE_PROVIDER_SITE_OTHER): Payer: Medicare HMO

## 2020-04-16 DIAGNOSIS — J309 Allergic rhinitis, unspecified: Secondary | ICD-10-CM | POA: Diagnosis not present

## 2020-05-14 ENCOUNTER — Ambulatory Visit (INDEPENDENT_AMBULATORY_CARE_PROVIDER_SITE_OTHER): Payer: Medicare HMO

## 2020-05-14 DIAGNOSIS — J309 Allergic rhinitis, unspecified: Secondary | ICD-10-CM

## 2020-06-10 NOTE — Progress Notes (Signed)
VIALS EXP 06-10-21

## 2020-06-11 ENCOUNTER — Ambulatory Visit (INDEPENDENT_AMBULATORY_CARE_PROVIDER_SITE_OTHER): Payer: Medicare HMO | Admitting: *Deleted

## 2020-06-11 DIAGNOSIS — J309 Allergic rhinitis, unspecified: Secondary | ICD-10-CM

## 2020-06-13 NOTE — Progress Notes (Signed)
EXP 06/13/21

## 2020-06-19 DIAGNOSIS — J3081 Allergic rhinitis due to animal (cat) (dog) hair and dander: Secondary | ICD-10-CM | POA: Diagnosis not present

## 2020-06-23 DIAGNOSIS — J3089 Other allergic rhinitis: Secondary | ICD-10-CM | POA: Diagnosis not present

## 2020-06-24 DIAGNOSIS — J301 Allergic rhinitis due to pollen: Secondary | ICD-10-CM | POA: Diagnosis not present

## 2020-07-10 ENCOUNTER — Ambulatory Visit (INDEPENDENT_AMBULATORY_CARE_PROVIDER_SITE_OTHER): Payer: Medicare HMO

## 2020-07-10 DIAGNOSIS — J309 Allergic rhinitis, unspecified: Secondary | ICD-10-CM

## 2020-07-11 DIAGNOSIS — M25572 Pain in left ankle and joints of left foot: Secondary | ICD-10-CM | POA: Diagnosis not present

## 2020-07-11 DIAGNOSIS — E7800 Pure hypercholesterolemia, unspecified: Secondary | ICD-10-CM | POA: Diagnosis not present

## 2020-07-11 DIAGNOSIS — D509 Iron deficiency anemia, unspecified: Secondary | ICD-10-CM | POA: Diagnosis not present

## 2020-07-11 DIAGNOSIS — E11649 Type 2 diabetes mellitus with hypoglycemia without coma: Secondary | ICD-10-CM | POA: Diagnosis not present

## 2020-07-11 DIAGNOSIS — N182 Chronic kidney disease, stage 2 (mild): Secondary | ICD-10-CM | POA: Diagnosis not present

## 2020-07-11 DIAGNOSIS — Z0001 Encounter for general adult medical examination with abnormal findings: Secondary | ICD-10-CM | POA: Diagnosis not present

## 2020-07-11 DIAGNOSIS — E559 Vitamin D deficiency, unspecified: Secondary | ICD-10-CM | POA: Diagnosis not present

## 2020-07-11 DIAGNOSIS — I119 Hypertensive heart disease without heart failure: Secondary | ICD-10-CM | POA: Diagnosis not present

## 2020-08-07 ENCOUNTER — Ambulatory Visit (INDEPENDENT_AMBULATORY_CARE_PROVIDER_SITE_OTHER): Payer: Medicare HMO

## 2020-08-07 DIAGNOSIS — J309 Allergic rhinitis, unspecified: Secondary | ICD-10-CM

## 2020-08-20 ENCOUNTER — Ambulatory Visit (INDEPENDENT_AMBULATORY_CARE_PROVIDER_SITE_OTHER): Payer: Medicare HMO

## 2020-08-20 DIAGNOSIS — J309 Allergic rhinitis, unspecified: Secondary | ICD-10-CM | POA: Diagnosis not present

## 2020-09-01 ENCOUNTER — Ambulatory Visit: Payer: Medicare HMO | Attending: Internal Medicine

## 2020-09-01 DIAGNOSIS — Z23 Encounter for immunization: Secondary | ICD-10-CM

## 2020-09-01 NOTE — Progress Notes (Signed)
   Covid-19 Vaccination Clinic  Name:  SHAWON DENZER    MRN: [id number] DOB: [birth year]  09/01/2020  Mr. Prevette was observed post Covid-19 immunization for 15 minutes without incident. He was provided with Vaccine Information Sheet and instruction to access the V-Safe system.   Mr. Mehra was instructed to call 911 with any severe reactions post vaccine: Marland Kitchen Difficulty breathing  . Swelling of face and throat  . A fast heartbeat  . A bad rash all over body  . Dizziness and weakness

## 2020-09-04 ENCOUNTER — Ambulatory Visit (INDEPENDENT_AMBULATORY_CARE_PROVIDER_SITE_OTHER): Payer: Medicare HMO

## 2020-09-04 DIAGNOSIS — J309 Allergic rhinitis, unspecified: Secondary | ICD-10-CM | POA: Diagnosis not present

## 2020-09-05 DIAGNOSIS — E7800 Pure hypercholesterolemia, unspecified: Secondary | ICD-10-CM | POA: Diagnosis not present

## 2020-09-05 DIAGNOSIS — D509 Iron deficiency anemia, unspecified: Secondary | ICD-10-CM | POA: Diagnosis not present

## 2020-09-05 DIAGNOSIS — N182 Chronic kidney disease, stage 2 (mild): Secondary | ICD-10-CM | POA: Diagnosis not present

## 2020-09-05 DIAGNOSIS — E559 Vitamin D deficiency, unspecified: Secondary | ICD-10-CM | POA: Diagnosis not present

## 2020-09-05 DIAGNOSIS — E11649 Type 2 diabetes mellitus with hypoglycemia without coma: Secondary | ICD-10-CM | POA: Diagnosis not present

## 2020-09-05 DIAGNOSIS — I119 Hypertensive heart disease without heart failure: Secondary | ICD-10-CM | POA: Diagnosis not present

## 2020-09-17 ENCOUNTER — Ambulatory Visit (INDEPENDENT_AMBULATORY_CARE_PROVIDER_SITE_OTHER): Payer: Medicare HMO

## 2020-09-17 DIAGNOSIS — J309 Allergic rhinitis, unspecified: Secondary | ICD-10-CM | POA: Diagnosis not present

## 2020-09-29 DIAGNOSIS — E11649 Type 2 diabetes mellitus with hypoglycemia without coma: Secondary | ICD-10-CM | POA: Diagnosis not present

## 2020-09-29 DIAGNOSIS — D509 Iron deficiency anemia, unspecified: Secondary | ICD-10-CM | POA: Diagnosis not present

## 2020-09-29 DIAGNOSIS — M25572 Pain in left ankle and joints of left foot: Secondary | ICD-10-CM | POA: Diagnosis not present

## 2020-09-29 DIAGNOSIS — I119 Hypertensive heart disease without heart failure: Secondary | ICD-10-CM | POA: Diagnosis not present

## 2020-09-29 DIAGNOSIS — E559 Vitamin D deficiency, unspecified: Secondary | ICD-10-CM | POA: Diagnosis not present

## 2020-09-29 DIAGNOSIS — E7800 Pure hypercholesterolemia, unspecified: Secondary | ICD-10-CM | POA: Diagnosis not present

## 2020-09-29 DIAGNOSIS — N182 Chronic kidney disease, stage 2 (mild): Secondary | ICD-10-CM | POA: Diagnosis not present

## 2020-09-30 ENCOUNTER — Ambulatory Visit (INDEPENDENT_AMBULATORY_CARE_PROVIDER_SITE_OTHER): Payer: Medicare HMO

## 2020-09-30 DIAGNOSIS — N183 Chronic kidney disease, stage 3 unspecified: Secondary | ICD-10-CM | POA: Diagnosis not present

## 2020-09-30 DIAGNOSIS — J309 Allergic rhinitis, unspecified: Secondary | ICD-10-CM

## 2020-09-30 DIAGNOSIS — E559 Vitamin D deficiency, unspecified: Secondary | ICD-10-CM | POA: Diagnosis not present

## 2020-09-30 DIAGNOSIS — I10 Essential (primary) hypertension: Secondary | ICD-10-CM | POA: Diagnosis not present

## 2020-10-02 DIAGNOSIS — N183 Chronic kidney disease, stage 3 unspecified: Secondary | ICD-10-CM | POA: Diagnosis not present

## 2020-10-02 DIAGNOSIS — E559 Vitamin D deficiency, unspecified: Secondary | ICD-10-CM | POA: Diagnosis not present

## 2020-10-02 DIAGNOSIS — I10 Essential (primary) hypertension: Secondary | ICD-10-CM | POA: Diagnosis not present

## 2020-10-02 DIAGNOSIS — N390 Urinary tract infection, site not specified: Secondary | ICD-10-CM | POA: Diagnosis not present

## 2020-10-03 DIAGNOSIS — N182 Chronic kidney disease, stage 2 (mild): Secondary | ICD-10-CM | POA: Diagnosis not present

## 2020-10-03 DIAGNOSIS — E11649 Type 2 diabetes mellitus with hypoglycemia without coma: Secondary | ICD-10-CM | POA: Diagnosis not present

## 2020-10-03 DIAGNOSIS — E7800 Pure hypercholesterolemia, unspecified: Secondary | ICD-10-CM | POA: Diagnosis not present

## 2020-10-03 DIAGNOSIS — D509 Iron deficiency anemia, unspecified: Secondary | ICD-10-CM | POA: Diagnosis not present

## 2020-10-03 DIAGNOSIS — I119 Hypertensive heart disease without heart failure: Secondary | ICD-10-CM | POA: Diagnosis not present

## 2020-10-03 DIAGNOSIS — E559 Vitamin D deficiency, unspecified: Secondary | ICD-10-CM | POA: Diagnosis not present

## 2020-10-14 ENCOUNTER — Ambulatory Visit: Payer: Self-pay

## 2020-10-30 ENCOUNTER — Ambulatory Visit (INDEPENDENT_AMBULATORY_CARE_PROVIDER_SITE_OTHER): Payer: Medicare HMO

## 2020-10-30 DIAGNOSIS — J309 Allergic rhinitis, unspecified: Secondary | ICD-10-CM

## 2020-11-16 DIAGNOSIS — M2763 Post-osseointegration mechanical failure of dental implant: Secondary | ICD-10-CM | POA: Diagnosis not present

## 2020-11-16 DIAGNOSIS — R6884 Jaw pain: Secondary | ICD-10-CM | POA: Diagnosis not present

## 2020-11-19 DIAGNOSIS — D529 Folate deficiency anemia, unspecified: Secondary | ICD-10-CM | POA: Diagnosis not present

## 2020-11-19 DIAGNOSIS — I10 Essential (primary) hypertension: Secondary | ICD-10-CM | POA: Diagnosis not present

## 2020-11-19 DIAGNOSIS — N182 Chronic kidney disease, stage 2 (mild): Secondary | ICD-10-CM | POA: Diagnosis not present

## 2020-11-20 DIAGNOSIS — E559 Vitamin D deficiency, unspecified: Secondary | ICD-10-CM | POA: Diagnosis not present

## 2020-11-20 DIAGNOSIS — N390 Urinary tract infection, site not specified: Secondary | ICD-10-CM | POA: Diagnosis not present

## 2020-11-20 DIAGNOSIS — I10 Essential (primary) hypertension: Secondary | ICD-10-CM | POA: Diagnosis not present

## 2020-11-20 DIAGNOSIS — N183 Chronic kidney disease, stage 3 unspecified: Secondary | ICD-10-CM | POA: Diagnosis not present

## 2020-11-24 DIAGNOSIS — M2763 Post-osseointegration mechanical failure of dental implant: Secondary | ICD-10-CM | POA: Diagnosis not present

## 2020-11-24 DIAGNOSIS — R6884 Jaw pain: Secondary | ICD-10-CM | POA: Diagnosis not present

## 2020-11-26 ENCOUNTER — Ambulatory Visit (INDEPENDENT_AMBULATORY_CARE_PROVIDER_SITE_OTHER): Payer: Medicare HMO

## 2020-11-26 DIAGNOSIS — J309 Allergic rhinitis, unspecified: Secondary | ICD-10-CM

## 2020-12-22 DIAGNOSIS — N183 Chronic kidney disease, stage 3 unspecified: Secondary | ICD-10-CM | POA: Diagnosis not present

## 2020-12-22 DIAGNOSIS — I10 Essential (primary) hypertension: Secondary | ICD-10-CM | POA: Diagnosis not present

## 2020-12-23 ENCOUNTER — Ambulatory Visit (INDEPENDENT_AMBULATORY_CARE_PROVIDER_SITE_OTHER): Payer: Medicare HMO

## 2020-12-23 DIAGNOSIS — I10 Essential (primary) hypertension: Secondary | ICD-10-CM | POA: Diagnosis not present

## 2020-12-23 DIAGNOSIS — R809 Proteinuria, unspecified: Secondary | ICD-10-CM | POA: Diagnosis not present

## 2020-12-23 DIAGNOSIS — E559 Vitamin D deficiency, unspecified: Secondary | ICD-10-CM | POA: Diagnosis not present

## 2020-12-23 DIAGNOSIS — J309 Allergic rhinitis, unspecified: Secondary | ICD-10-CM

## 2020-12-23 DIAGNOSIS — N183 Chronic kidney disease, stage 3 unspecified: Secondary | ICD-10-CM | POA: Diagnosis not present

## 2020-12-23 DIAGNOSIS — N390 Urinary tract infection, site not specified: Secondary | ICD-10-CM | POA: Diagnosis not present

## 2021-01-01 DIAGNOSIS — D509 Iron deficiency anemia, unspecified: Secondary | ICD-10-CM | POA: Diagnosis not present

## 2021-01-01 DIAGNOSIS — E7800 Pure hypercholesterolemia, unspecified: Secondary | ICD-10-CM | POA: Diagnosis not present

## 2021-01-01 DIAGNOSIS — E559 Vitamin D deficiency, unspecified: Secondary | ICD-10-CM | POA: Diagnosis not present

## 2021-01-01 DIAGNOSIS — E11649 Type 2 diabetes mellitus with hypoglycemia without coma: Secondary | ICD-10-CM | POA: Diagnosis not present

## 2021-01-01 DIAGNOSIS — N182 Chronic kidney disease, stage 2 (mild): Secondary | ICD-10-CM | POA: Diagnosis not present

## 2021-01-01 DIAGNOSIS — I119 Hypertensive heart disease without heart failure: Secondary | ICD-10-CM | POA: Diagnosis not present

## 2021-01-20 ENCOUNTER — Ambulatory Visit (INDEPENDENT_AMBULATORY_CARE_PROVIDER_SITE_OTHER): Payer: Medicare HMO

## 2021-01-20 DIAGNOSIS — J309 Allergic rhinitis, unspecified: Secondary | ICD-10-CM | POA: Diagnosis not present

## 2021-02-02 DIAGNOSIS — J3081 Allergic rhinitis due to animal (cat) (dog) hair and dander: Secondary | ICD-10-CM | POA: Diagnosis not present

## 2021-02-02 NOTE — Progress Notes (Signed)
VIALS EXP 02-02-22

## 2021-02-03 DIAGNOSIS — J3089 Other allergic rhinitis: Secondary | ICD-10-CM | POA: Diagnosis not present

## 2021-02-04 DIAGNOSIS — J301 Allergic rhinitis due to pollen: Secondary | ICD-10-CM | POA: Diagnosis not present

## 2021-02-18 ENCOUNTER — Ambulatory Visit (INDEPENDENT_AMBULATORY_CARE_PROVIDER_SITE_OTHER): Payer: Medicare HMO | Admitting: *Deleted

## 2021-02-18 DIAGNOSIS — J309 Allergic rhinitis, unspecified: Secondary | ICD-10-CM

## 2021-03-16 DIAGNOSIS — D649 Anemia, unspecified: Secondary | ICD-10-CM | POA: Diagnosis not present

## 2021-03-16 DIAGNOSIS — D509 Iron deficiency anemia, unspecified: Secondary | ICD-10-CM | POA: Diagnosis not present

## 2021-03-16 DIAGNOSIS — E611 Iron deficiency: Secondary | ICD-10-CM | POA: Diagnosis not present

## 2021-03-17 ENCOUNTER — Ambulatory Visit (INDEPENDENT_AMBULATORY_CARE_PROVIDER_SITE_OTHER): Payer: Medicare HMO

## 2021-03-17 DIAGNOSIS — J309 Allergic rhinitis, unspecified: Secondary | ICD-10-CM

## 2021-03-24 DIAGNOSIS — N183 Chronic kidney disease, stage 3 unspecified: Secondary | ICD-10-CM | POA: Diagnosis not present

## 2021-03-24 DIAGNOSIS — I10 Essential (primary) hypertension: Secondary | ICD-10-CM | POA: Diagnosis not present

## 2021-03-24 DIAGNOSIS — N390 Urinary tract infection, site not specified: Secondary | ICD-10-CM | POA: Diagnosis not present

## 2021-03-24 DIAGNOSIS — E559 Vitamin D deficiency, unspecified: Secondary | ICD-10-CM | POA: Diagnosis not present

## 2021-03-25 DIAGNOSIS — I119 Hypertensive heart disease without heart failure: Secondary | ICD-10-CM | POA: Diagnosis not present

## 2021-03-25 DIAGNOSIS — D508 Other iron deficiency anemias: Secondary | ICD-10-CM | POA: Diagnosis not present

## 2021-03-25 DIAGNOSIS — N182 Chronic kidney disease, stage 2 (mild): Secondary | ICD-10-CM | POA: Diagnosis not present

## 2021-03-25 DIAGNOSIS — E11649 Type 2 diabetes mellitus with hypoglycemia without coma: Secondary | ICD-10-CM | POA: Diagnosis not present

## 2021-03-25 DIAGNOSIS — Z125 Encounter for screening for malignant neoplasm of prostate: Secondary | ICD-10-CM | POA: Diagnosis not present

## 2021-03-25 DIAGNOSIS — Z0001 Encounter for general adult medical examination with abnormal findings: Secondary | ICD-10-CM | POA: Diagnosis not present

## 2021-03-25 DIAGNOSIS — E559 Vitamin D deficiency, unspecified: Secondary | ICD-10-CM | POA: Diagnosis not present

## 2021-03-25 DIAGNOSIS — E7800 Pure hypercholesterolemia, unspecified: Secondary | ICD-10-CM | POA: Diagnosis not present

## 2021-03-30 DIAGNOSIS — K219 Gastro-esophageal reflux disease without esophagitis: Secondary | ICD-10-CM | POA: Diagnosis not present

## 2021-03-30 DIAGNOSIS — Z86010 Personal history of colonic polyps: Secondary | ICD-10-CM | POA: Diagnosis not present

## 2021-03-30 DIAGNOSIS — D509 Iron deficiency anemia, unspecified: Secondary | ICD-10-CM | POA: Diagnosis not present

## 2021-04-01 DIAGNOSIS — Z1211 Encounter for screening for malignant neoplasm of colon: Secondary | ICD-10-CM | POA: Diagnosis not present

## 2021-04-01 DIAGNOSIS — K2950 Unspecified chronic gastritis without bleeding: Secondary | ICD-10-CM | POA: Diagnosis not present

## 2021-04-01 DIAGNOSIS — K641 Second degree hemorrhoids: Secondary | ICD-10-CM | POA: Diagnosis not present

## 2021-04-01 DIAGNOSIS — D3A8 Other benign neuroendocrine tumors: Secondary | ICD-10-CM | POA: Diagnosis not present

## 2021-04-01 DIAGNOSIS — K2289 Other specified disease of esophagus: Secondary | ICD-10-CM | POA: Diagnosis not present

## 2021-04-01 DIAGNOSIS — D123 Benign neoplasm of transverse colon: Secondary | ICD-10-CM | POA: Diagnosis not present

## 2021-04-01 DIAGNOSIS — B9681 Helicobacter pylori [H. pylori] as the cause of diseases classified elsewhere: Secondary | ICD-10-CM | POA: Diagnosis not present

## 2021-04-01 DIAGNOSIS — K6289 Other specified diseases of anus and rectum: Secondary | ICD-10-CM | POA: Diagnosis not present

## 2021-04-01 DIAGNOSIS — K2210 Ulcer of esophagus without bleeding: Secondary | ICD-10-CM | POA: Diagnosis not present

## 2021-04-01 DIAGNOSIS — Z86010 Personal history of colonic polyps: Secondary | ICD-10-CM | POA: Diagnosis not present

## 2021-04-01 DIAGNOSIS — K3189 Other diseases of stomach and duodenum: Secondary | ICD-10-CM | POA: Diagnosis not present

## 2021-04-01 DIAGNOSIS — D129 Benign neoplasm of anus and anal canal: Secondary | ICD-10-CM | POA: Diagnosis not present

## 2021-04-01 DIAGNOSIS — K621 Rectal polyp: Secondary | ICD-10-CM | POA: Diagnosis not present

## 2021-04-01 DIAGNOSIS — K2100 Gastro-esophageal reflux disease with esophagitis, without bleeding: Secondary | ICD-10-CM | POA: Diagnosis not present

## 2021-04-01 DIAGNOSIS — K5730 Diverticulosis of large intestine without perforation or abscess without bleeding: Secondary | ICD-10-CM | POA: Diagnosis not present

## 2021-04-01 DIAGNOSIS — D509 Iron deficiency anemia, unspecified: Secondary | ICD-10-CM | POA: Diagnosis not present

## 2021-04-01 DIAGNOSIS — D3A010 Benign carcinoid tumor of the duodenum: Secondary | ICD-10-CM | POA: Diagnosis not present

## 2021-04-01 DIAGNOSIS — K640 First degree hemorrhoids: Secondary | ICD-10-CM | POA: Diagnosis not present

## 2021-04-01 DIAGNOSIS — K317 Polyp of stomach and duodenum: Secondary | ICD-10-CM | POA: Diagnosis not present

## 2021-04-02 ENCOUNTER — Ambulatory Visit (INDEPENDENT_AMBULATORY_CARE_PROVIDER_SITE_OTHER): Payer: Medicare HMO

## 2021-04-02 DIAGNOSIS — J309 Allergic rhinitis, unspecified: Secondary | ICD-10-CM

## 2021-04-15 ENCOUNTER — Ambulatory Visit (INDEPENDENT_AMBULATORY_CARE_PROVIDER_SITE_OTHER): Payer: Medicare HMO

## 2021-04-15 DIAGNOSIS — J309 Allergic rhinitis, unspecified: Secondary | ICD-10-CM

## 2021-04-29 ENCOUNTER — Ambulatory Visit (INDEPENDENT_AMBULATORY_CARE_PROVIDER_SITE_OTHER): Payer: Medicare Other

## 2021-04-29 DIAGNOSIS — J309 Allergic rhinitis, unspecified: Secondary | ICD-10-CM | POA: Diagnosis not present

## 2021-06-01 ENCOUNTER — Ambulatory Visit (INDEPENDENT_AMBULATORY_CARE_PROVIDER_SITE_OTHER): Payer: Medicare Other

## 2021-06-01 DIAGNOSIS — J309 Allergic rhinitis, unspecified: Secondary | ICD-10-CM | POA: Diagnosis not present

## 2021-06-18 ENCOUNTER — Ambulatory Visit (INDEPENDENT_AMBULATORY_CARE_PROVIDER_SITE_OTHER): Payer: Medicare Other

## 2021-06-18 DIAGNOSIS — J309 Allergic rhinitis, unspecified: Secondary | ICD-10-CM

## 2021-07-15 ENCOUNTER — Ambulatory Visit (INDEPENDENT_AMBULATORY_CARE_PROVIDER_SITE_OTHER): Payer: Medicare Other

## 2021-07-15 DIAGNOSIS — J309 Allergic rhinitis, unspecified: Secondary | ICD-10-CM

## 2021-07-30 NOTE — Progress Notes (Signed)
VIALS MADE. EXP 07-30-22

## 2021-07-31 DIAGNOSIS — J3081 Allergic rhinitis due to animal (cat) (dog) hair and dander: Secondary | ICD-10-CM | POA: Diagnosis not present

## 2021-08-03 DIAGNOSIS — J3089 Other allergic rhinitis: Secondary | ICD-10-CM | POA: Diagnosis not present

## 2021-08-04 DIAGNOSIS — J301 Allergic rhinitis due to pollen: Secondary | ICD-10-CM | POA: Diagnosis not present

## 2021-08-11 ENCOUNTER — Ambulatory Visit: Payer: Medicare Other | Attending: Internal Medicine

## 2021-08-11 ENCOUNTER — Other Ambulatory Visit (HOSPITAL_BASED_OUTPATIENT_CLINIC_OR_DEPARTMENT_OTHER): Payer: Self-pay

## 2021-08-11 DIAGNOSIS — Z23 Encounter for immunization: Secondary | ICD-10-CM

## 2021-08-11 MED ORDER — INFLUENZA VAC A&B SA ADJ QUAD 0.5 ML IM PRSY
PREFILLED_SYRINGE | INTRAMUSCULAR | 0 refills | Status: DC
  Filled 2021-08-11: qty 0.5, 1d supply, fill #0

## 2021-08-11 NOTE — Progress Notes (Signed)
   Covid-19 Vaccination Clinic  Name:  AASHISH HAMM    MRN: [id number] DOB: September 06, 1935  08/11/2021  Mr. Ursin was observed post Covid-19 immunization for 15 minutes without incident. He was provided with Vaccine Information Sheet and instruction to access the V-Safe system.   Mr. Raczka was instructed to call 911 with any severe reactions post vaccine: Difficulty breathing  Swelling of face and throat  A fast heartbeat  A bad rash all over body  Dizziness and weakness

## 2021-08-13 ENCOUNTER — Ambulatory Visit (INDEPENDENT_AMBULATORY_CARE_PROVIDER_SITE_OTHER): Payer: Medicare Other

## 2021-08-13 DIAGNOSIS — J309 Allergic rhinitis, unspecified: Secondary | ICD-10-CM

## 2021-09-07 ENCOUNTER — Other Ambulatory Visit (HOSPITAL_BASED_OUTPATIENT_CLINIC_OR_DEPARTMENT_OTHER): Payer: Self-pay

## 2021-09-07 MED ORDER — MODERNA COVID-19 BIVAL BOOSTER 50 MCG/0.5ML IM SUSP
INTRAMUSCULAR | 0 refills | Status: DC
  Filled 2021-09-07: qty 0.5, 1d supply, fill #0

## 2021-09-08 ENCOUNTER — Other Ambulatory Visit (HOSPITAL_BASED_OUTPATIENT_CLINIC_OR_DEPARTMENT_OTHER): Payer: Self-pay

## 2021-09-08 MED ORDER — MODERNA COVID-19 BIVAL BOOSTER 50 MCG/0.5ML IM SUSP
INTRAMUSCULAR | 0 refills | Status: DC
  Filled 2021-09-08: qty 0.5, 1d supply, fill #0

## 2021-09-09 ENCOUNTER — Ambulatory Visit (INDEPENDENT_AMBULATORY_CARE_PROVIDER_SITE_OTHER): Payer: Medicare Other

## 2021-09-09 DIAGNOSIS — J309 Allergic rhinitis, unspecified: Secondary | ICD-10-CM

## 2021-09-30 NOTE — Progress Notes (Addendum)
VIALS NOT NEEDED YET. 

## 2021-10-07 ENCOUNTER — Ambulatory Visit (INDEPENDENT_AMBULATORY_CARE_PROVIDER_SITE_OTHER): Payer: Medicare Other

## 2021-10-07 DIAGNOSIS — J309 Allergic rhinitis, unspecified: Secondary | ICD-10-CM | POA: Diagnosis not present

## 2021-11-11 ENCOUNTER — Ambulatory Visit (INDEPENDENT_AMBULATORY_CARE_PROVIDER_SITE_OTHER): Payer: Medicare HMO

## 2021-11-11 DIAGNOSIS — D509 Iron deficiency anemia, unspecified: Secondary | ICD-10-CM | POA: Diagnosis not present

## 2021-11-11 DIAGNOSIS — J309 Allergic rhinitis, unspecified: Secondary | ICD-10-CM

## 2021-11-25 ENCOUNTER — Ambulatory Visit (INDEPENDENT_AMBULATORY_CARE_PROVIDER_SITE_OTHER): Payer: Medicare HMO

## 2021-11-25 DIAGNOSIS — J309 Allergic rhinitis, unspecified: Secondary | ICD-10-CM

## 2021-12-07 ENCOUNTER — Other Ambulatory Visit (HOSPITAL_BASED_OUTPATIENT_CLINIC_OR_DEPARTMENT_OTHER): Payer: Self-pay

## 2021-12-07 ENCOUNTER — Other Ambulatory Visit: Payer: Self-pay

## 2021-12-07 ENCOUNTER — Encounter (HOSPITAL_BASED_OUTPATIENT_CLINIC_OR_DEPARTMENT_OTHER): Payer: Self-pay

## 2021-12-07 ENCOUNTER — Emergency Department (HOSPITAL_BASED_OUTPATIENT_CLINIC_OR_DEPARTMENT_OTHER)
Admission: EM | Admit: 2021-12-07 | Discharge: 2021-12-07 | Disposition: A | Discharge status: Home / Self Care | Payer: Medicare HMO | Attending: Emergency Medicine | Admitting: Emergency Medicine

## 2021-12-07 DIAGNOSIS — I10 Essential (primary) hypertension: Secondary | ICD-10-CM | POA: Diagnosis not present

## 2021-12-07 DIAGNOSIS — Z79899 Other long term (current) drug therapy: Secondary | ICD-10-CM | POA: Diagnosis not present

## 2021-12-07 DIAGNOSIS — K0889 Other specified disorders of teeth and supporting structures: Secondary | ICD-10-CM | POA: Diagnosis not present

## 2021-12-07 DIAGNOSIS — K047 Periapical abscess without sinus: Secondary | ICD-10-CM | POA: Diagnosis not present

## 2021-12-07 HISTORY — DX: Benign prostatic hyperplasia without lower urinary tract symptoms: N40.0

## 2021-12-07 HISTORY — DX: Vitamin D deficiency, unspecified: E55.9

## 2021-12-07 HISTORY — DX: Essential (primary) hypertension: I10

## 2021-12-07 HISTORY — DX: Pure hypercholesterolemia, unspecified: E78.00

## 2021-12-07 MED ORDER — CLINDAMYCIN HCL 300 MG PO CAPS
300.0000 mg | ORAL_CAPSULE | Freq: Four times a day (QID) | ORAL | 0 refills | Status: AC
  Filled 2021-12-07: qty 28, 7d supply, fill #0

## 2021-12-07 NOTE — ED Triage Notes (Signed)
Pt c/o swelling/redness to right side of face x 3 days-states he has taken (left over) augmentin x 5 doses-NAD-steady gait

## 2021-12-07 NOTE — ED Provider Notes (Signed)
[facility] EMERGENCY DEPARTMENT Provider Note   CSN: [id number] Arrival date & time: 12/07/21  1113     History  Chief Complaint  Patient presents with   Facial Swelling    Javier Ross is a 87 y.o. male.  This is a 87 y.o. male  with significant medical history as below, including HLD, HTN who presents to the ED with complaint of right-sided facial swelling.  Patient retired Pharmacist, community.  Reports he had a bridge that broke 3 days ago.  Pain and swelling to left side of his face, left upper jaw.  He has been taking Augmentin at home, symptoms have mildly improved.  Ongoing swelling to right side of face.  No pain with jaw opening and closure, no voice changes, no difficulty swallowing, no fevers or chills, lymphadenopathy, no pain with eye movement or blurry vision, no headaches or vision changes.     Past Medical History: No date: BPH (benign prostatic hyperplasia) No date: High cholesterol No date: Hypertension No date: Vitamin D deficiency  Past Surgical History: No date: ANKLE SURGERY No date: HERNIA REPAIR    The history is provided by the patient.      Home Medications Prior to Admission medications   Medication Sig Start Date End Date Taking? Authorizing Provider  clindamycin (CLEOCIN) 300 MG capsule Take 1 capsule (300 mg total) by mouth every 6 (six) hours for 7 days. 12/07/21 12/14/21 Yes Wynona Dove A, DO  amLODipine-benazepril (LOTREL) 10-40 MG capsule Take by mouth.    [provider]  aspirin 81 MG tablet Take 81 mg by mouth daily.    [provider]  Cholecalciferol (VITAMIN D-3 PO) Take 1 capsule by mouth daily.    [provider]  EPINEPHrine (EPIPEN 2-PAK) 0.3 mg/0.3 mL IJ SOAJ injection Inject 0.3 mLs (0.3 mg total) into the muscle once. 07/23/15   Leda Roys, MD  esomeprazole (NEXIUM) 40 MG capsule Take 40 mg by mouth daily at 12 noon.    [provider]  finasteride (PROSCAR) 5 MG tablet Take 5 mg by  mouth daily.    [provider]  influenza vaccine adjuvanted (FLUAD) 0.5 ML injection Inject into the muscle. 08/11/21   Carlyle Basques, MD  mometasone (NASONEX) 50 MCG/ACT nasal spray Place 2 sprays into the nose daily.    [provider]  niacin-lovastatin (ADVICOR) 500-20 MG 24 hr tablet Take 1 tablet by mouth at bedtime.    [provider]  triamterene-hydrochlorothiazide (MAXZIDE) 75-50 MG tablet Take 1 tablet by mouth. Pt. Takes Monday-Wednesday-friday    [provider]      Allergies    Patient has no known allergies.    Review of Systems   Review of Systems  Constitutional:  Negative for chills and fever.  HENT:  Positive for dental problem and facial swelling. Negative for trouble swallowing.   Eyes:  Negative for photophobia and visual disturbance.  Respiratory:  Negative for cough and shortness of breath.   Cardiovascular:  Negative for chest pain and palpitations.  Gastrointestinal:  Negative for abdominal pain, nausea and vomiting.  Endocrine: Negative for polydipsia and polyuria.  Genitourinary:  Negative for difficulty urinating and hematuria.  Musculoskeletal:  Negative for gait problem and joint swelling.  Skin:  Negative for pallor and rash.  Neurological:  Negative for syncope and headaches.  Psychiatric/Behavioral:  Negative for agitation and confusion.    Physical Exam Updated Vital Signs BP (!) 151/82    Pulse 84  Temp 97.8 F (36.6 C) (Oral)    Resp 15    Ht 5\' 10"  (1.778 m)    Wt 79.4 kg    SpO2 95%    BMI 25.11 kg/m  Physical Exam Vitals and nursing note reviewed.  Constitutional:      General: He is not in acute distress.    Appearance: He is well-developed.  HENT:     Head: Normocephalic and atraumatic.     Jaw: There is normal jaw occlusion. No trismus, tenderness or pain on movement.     Comments: Mild swelling noted to right side of face.  Tenderness to right side maxilla.  No tongue or lip swelling, uvula is  midline.  No dyspnea    Right Ear: External ear normal.     Left Ear: External ear normal.     Mouth/Throat:     Mouth: Mucous membranes are moist.  Eyes:     General: No scleral icterus. Cardiovascular:     Rate and Rhythm: Normal rate and regular rhythm.     Pulses: Normal pulses.     Heart sounds: Normal heart sounds.  Pulmonary:     Effort: Pulmonary effort is normal. No respiratory distress.     Breath sounds: Normal breath sounds.  Abdominal:     General: Abdomen is flat.     Palpations: Abdomen is soft.     Tenderness: There is no abdominal tenderness.  Musculoskeletal:        General: Normal range of motion.     Cervical back: Normal range of motion.     Right lower leg: No edema.     Left lower leg: No edema.  Skin:    General: Skin is warm and dry.     Capillary Refill: Capillary refill takes less than 2 seconds.  Neurological:     Mental Status: He is alert and oriented to person, place, and time.     GCS: GCS eye subscore is 4. GCS verbal subscore is 5. GCS motor subscore is 6.  Psychiatric:        Mood and Affect: Mood normal.        Behavior: Behavior normal.    ED Results / Procedures / Treatments   Labs (all labs ordered are listed, but only abnormal results are displayed) Labs Reviewed - No data to display  EKG None  Radiology No results found.  Procedures Procedures    Medications Ordered in ED Medications - No data to display  ED Course/ Medical Decision Making/ A&P                           Medical Decision Making Risk Prescription drug management.   Initial Impression and Ddx Right-sided facial swelling, concern for possible dental abscess, Ludwig angina, deep space infection and Lemierre syndrome were considered along with out serious etiologies  Patient PMH that increases complexity of ED encounter: age  Interpretation of Diagnostics N/a  Patient Reassessment and Ultimate Disposition/Management No acute distress, breathing  normally, no trismus or stridor, no drooling or dysphagia/dysphonia.   Low suspicion for Ludwig's angina, lemierre syndrome, septal cellulitis.   Patient management required discussion with the following services or consulting groups:  None  Complexity of Problems Addressed Acute uncomplicated illness or injury with no diagnostics  Additional Data Reviewed and Analyzed Further history obtained from: Past medical history and medications listed in the EMR  Patient Encounter Risk Assessment Prescriptions   The patient improved  significantly and was discharged in stable condition. Detailed discussions were had with the patient regarding current findings, and need for close f/u with PCP or on call doctor. The patient has been instructed to return immediately if the symptoms worsen in any way for re-evaluation. Patient verbalized understanding and is in agreement with current care plan. All questions answered prior to discharge.          Final Clinical Impression(s) / ED Diagnoses Final diagnoses:  Dental abscess    Rx / DC Orders ED Discharge Orders          Ordered    clindamycin (CLEOCIN) 300 MG capsule  Every 6 hours        12/07/21 1547              Wynona Dove A, DO 12/07/21 1600

## 2021-12-07 NOTE — Discharge Instructions (Addendum)
It was a pleasure caring for you today in the emergency department.  Please use Listerine 3x daily swish and spit, orajel as needed. Take over the counter motrin/tylenol as needed per manufacturer's instructions.   Please return to the emergency department for any worsening or worrisome symptoms.

## 2021-12-07 NOTE — ED Notes (Signed)
Patient given discharge instructions. Questions were answered. Patient verbalized understanding of discharge instructions and care at home.  

## 2021-12-07 NOTE — ED Notes (Signed)
ED Provider at bedside. 

## 2021-12-09 ENCOUNTER — Ambulatory Visit (INDEPENDENT_AMBULATORY_CARE_PROVIDER_SITE_OTHER): Payer: Medicare HMO

## 2021-12-09 DIAGNOSIS — J309 Allergic rhinitis, unspecified: Secondary | ICD-10-CM

## 2021-12-17 DIAGNOSIS — K089 Disorder of teeth and supporting structures, unspecified: Secondary | ICD-10-CM | POA: Diagnosis not present

## 2021-12-17 DIAGNOSIS — R69 Illness, unspecified: Secondary | ICD-10-CM | POA: Diagnosis not present

## 2021-12-17 DIAGNOSIS — K0381 Cracked tooth: Secondary | ICD-10-CM | POA: Diagnosis not present

## 2021-12-17 DIAGNOSIS — K029 Dental caries, unspecified: Secondary | ICD-10-CM | POA: Diagnosis not present

## 2021-12-24 ENCOUNTER — Ambulatory Visit (INDEPENDENT_AMBULATORY_CARE_PROVIDER_SITE_OTHER): Payer: Medicare HMO

## 2021-12-24 DIAGNOSIS — J309 Allergic rhinitis, unspecified: Secondary | ICD-10-CM

## 2022-01-06 ENCOUNTER — Ambulatory Visit (INDEPENDENT_AMBULATORY_CARE_PROVIDER_SITE_OTHER): Payer: Medicare HMO

## 2022-01-06 DIAGNOSIS — J309 Allergic rhinitis, unspecified: Secondary | ICD-10-CM

## 2022-02-03 ENCOUNTER — Ambulatory Visit (INDEPENDENT_AMBULATORY_CARE_PROVIDER_SITE_OTHER): Payer: Medicare HMO

## 2022-02-03 DIAGNOSIS — J309 Allergic rhinitis, unspecified: Secondary | ICD-10-CM | POA: Diagnosis not present

## 2022-02-08 DIAGNOSIS — D509 Iron deficiency anemia, unspecified: Secondary | ICD-10-CM | POA: Diagnosis not present

## 2022-03-01 DIAGNOSIS — E11649 Type 2 diabetes mellitus with hypoglycemia without coma: Secondary | ICD-10-CM | POA: Diagnosis not present

## 2022-03-01 DIAGNOSIS — Z125 Encounter for screening for malignant neoplasm of prostate: Secondary | ICD-10-CM | POA: Diagnosis not present

## 2022-03-01 DIAGNOSIS — E559 Vitamin D deficiency, unspecified: Secondary | ICD-10-CM | POA: Diagnosis not present

## 2022-03-01 DIAGNOSIS — Z0001 Encounter for general adult medical examination with abnormal findings: Secondary | ICD-10-CM | POA: Diagnosis not present

## 2022-03-01 DIAGNOSIS — E7800 Pure hypercholesterolemia, unspecified: Secondary | ICD-10-CM | POA: Diagnosis not present

## 2022-03-01 DIAGNOSIS — I119 Hypertensive heart disease without heart failure: Secondary | ICD-10-CM | POA: Diagnosis not present

## 2022-03-01 DIAGNOSIS — Z1329 Encounter for screening for other suspected endocrine disorder: Secondary | ICD-10-CM | POA: Diagnosis not present

## 2022-03-01 DIAGNOSIS — Z131 Encounter for screening for diabetes mellitus: Secondary | ICD-10-CM | POA: Diagnosis not present

## 2022-03-01 DIAGNOSIS — Z136 Encounter for screening for cardiovascular disorders: Secondary | ICD-10-CM | POA: Diagnosis not present

## 2022-03-03 ENCOUNTER — Ambulatory Visit (INDEPENDENT_AMBULATORY_CARE_PROVIDER_SITE_OTHER): Payer: Medicare HMO

## 2022-03-03 DIAGNOSIS — J309 Allergic rhinitis, unspecified: Secondary | ICD-10-CM

## 2022-03-15 DIAGNOSIS — I119 Hypertensive heart disease without heart failure: Secondary | ICD-10-CM | POA: Diagnosis not present

## 2022-03-15 DIAGNOSIS — E1122 Type 2 diabetes mellitus with diabetic chronic kidney disease: Secondary | ICD-10-CM | POA: Diagnosis not present

## 2022-03-15 DIAGNOSIS — E11649 Type 2 diabetes mellitus with hypoglycemia without coma: Secondary | ICD-10-CM | POA: Diagnosis not present

## 2022-03-15 DIAGNOSIS — E7800 Pure hypercholesterolemia, unspecified: Secondary | ICD-10-CM | POA: Diagnosis not present

## 2022-03-15 DIAGNOSIS — E559 Vitamin D deficiency, unspecified: Secondary | ICD-10-CM | POA: Diagnosis not present

## 2022-03-15 DIAGNOSIS — N182 Chronic kidney disease, stage 2 (mild): Secondary | ICD-10-CM | POA: Diagnosis not present

## 2022-03-18 DIAGNOSIS — N182 Chronic kidney disease, stage 2 (mild): Secondary | ICD-10-CM | POA: Diagnosis not present

## 2022-03-18 DIAGNOSIS — E559 Vitamin D deficiency, unspecified: Secondary | ICD-10-CM | POA: Diagnosis not present

## 2022-03-18 DIAGNOSIS — I10 Essential (primary) hypertension: Secondary | ICD-10-CM | POA: Diagnosis not present

## 2022-03-23 DIAGNOSIS — N182 Chronic kidney disease, stage 2 (mild): Secondary | ICD-10-CM | POA: Diagnosis not present

## 2022-03-23 DIAGNOSIS — E559 Vitamin D deficiency, unspecified: Secondary | ICD-10-CM | POA: Diagnosis not present

## 2022-03-23 DIAGNOSIS — I10 Essential (primary) hypertension: Secondary | ICD-10-CM | POA: Diagnosis not present

## 2022-03-23 DIAGNOSIS — N390 Urinary tract infection, site not specified: Secondary | ICD-10-CM | POA: Diagnosis not present

## 2022-03-23 DIAGNOSIS — E119 Type 2 diabetes mellitus without complications: Secondary | ICD-10-CM | POA: Diagnosis not present

## 2022-03-31 ENCOUNTER — Ambulatory Visit (INDEPENDENT_AMBULATORY_CARE_PROVIDER_SITE_OTHER): Payer: Medicare HMO

## 2022-03-31 DIAGNOSIS — J309 Allergic rhinitis, unspecified: Secondary | ICD-10-CM | POA: Diagnosis not present

## 2022-04-14 DIAGNOSIS — J3081 Allergic rhinitis due to animal (cat) (dog) hair and dander: Secondary | ICD-10-CM | POA: Diagnosis not present

## 2022-04-14 NOTE — Progress Notes (Signed)
EXP 04/15/23

## 2022-04-15 DIAGNOSIS — J320 Chronic maxillary sinusitis: Secondary | ICD-10-CM | POA: Diagnosis not present

## 2022-04-16 DIAGNOSIS — J301 Allergic rhinitis due to pollen: Secondary | ICD-10-CM

## 2022-04-28 ENCOUNTER — Ambulatory Visit (INDEPENDENT_AMBULATORY_CARE_PROVIDER_SITE_OTHER): Payer: Medicare HMO

## 2022-04-28 DIAGNOSIS — J309 Allergic rhinitis, unspecified: Secondary | ICD-10-CM

## 2022-05-04 DIAGNOSIS — N182 Chronic kidney disease, stage 2 (mild): Secondary | ICD-10-CM | POA: Diagnosis not present

## 2022-05-04 DIAGNOSIS — I10 Essential (primary) hypertension: Secondary | ICD-10-CM | POA: Diagnosis not present

## 2022-05-06 DIAGNOSIS — D509 Iron deficiency anemia, unspecified: Secondary | ICD-10-CM | POA: Diagnosis not present

## 2022-05-06 DIAGNOSIS — N182 Chronic kidney disease, stage 2 (mild): Secondary | ICD-10-CM | POA: Diagnosis not present

## 2022-05-06 DIAGNOSIS — E1165 Type 2 diabetes mellitus with hyperglycemia: Secondary | ICD-10-CM | POA: Diagnosis not present

## 2022-05-06 DIAGNOSIS — N390 Urinary tract infection, site not specified: Secondary | ICD-10-CM | POA: Diagnosis not present

## 2022-05-06 DIAGNOSIS — I10 Essential (primary) hypertension: Secondary | ICD-10-CM | POA: Diagnosis not present

## 2022-05-10 DIAGNOSIS — D509 Iron deficiency anemia, unspecified: Secondary | ICD-10-CM | POA: Diagnosis not present

## 2022-05-26 ENCOUNTER — Ambulatory Visit (INDEPENDENT_AMBULATORY_CARE_PROVIDER_SITE_OTHER): Payer: Medicare HMO

## 2022-05-26 DIAGNOSIS — J309 Allergic rhinitis, unspecified: Secondary | ICD-10-CM | POA: Diagnosis not present

## 2022-06-16 DIAGNOSIS — N182 Chronic kidney disease, stage 2 (mild): Secondary | ICD-10-CM | POA: Diagnosis not present

## 2022-06-16 DIAGNOSIS — I10 Essential (primary) hypertension: Secondary | ICD-10-CM | POA: Diagnosis not present

## 2022-06-18 DIAGNOSIS — E1165 Type 2 diabetes mellitus with hyperglycemia: Secondary | ICD-10-CM | POA: Diagnosis not present

## 2022-06-18 DIAGNOSIS — N182 Chronic kidney disease, stage 2 (mild): Secondary | ICD-10-CM | POA: Diagnosis not present

## 2022-06-18 DIAGNOSIS — D649 Anemia, unspecified: Secondary | ICD-10-CM | POA: Diagnosis not present

## 2022-06-18 DIAGNOSIS — I10 Essential (primary) hypertension: Secondary | ICD-10-CM | POA: Diagnosis not present

## 2022-06-18 DIAGNOSIS — N390 Urinary tract infection, site not specified: Secondary | ICD-10-CM | POA: Diagnosis not present

## 2022-06-22 ENCOUNTER — Ambulatory Visit (INDEPENDENT_AMBULATORY_CARE_PROVIDER_SITE_OTHER): Payer: Medicare HMO

## 2022-06-22 DIAGNOSIS — J309 Allergic rhinitis, unspecified: Secondary | ICD-10-CM

## 2022-07-07 ENCOUNTER — Ambulatory Visit (INDEPENDENT_AMBULATORY_CARE_PROVIDER_SITE_OTHER): Payer: Medicare HMO

## 2022-07-07 DIAGNOSIS — J309 Allergic rhinitis, unspecified: Secondary | ICD-10-CM | POA: Diagnosis not present

## 2022-07-16 DIAGNOSIS — N182 Chronic kidney disease, stage 2 (mild): Secondary | ICD-10-CM | POA: Diagnosis not present

## 2022-07-16 DIAGNOSIS — E7800 Pure hypercholesterolemia, unspecified: Secondary | ICD-10-CM | POA: Diagnosis not present

## 2022-07-16 DIAGNOSIS — E559 Vitamin D deficiency, unspecified: Secondary | ICD-10-CM | POA: Diagnosis not present

## 2022-07-16 DIAGNOSIS — I119 Hypertensive heart disease without heart failure: Secondary | ICD-10-CM | POA: Diagnosis not present

## 2022-07-16 DIAGNOSIS — Z23 Encounter for immunization: Secondary | ICD-10-CM | POA: Diagnosis not present

## 2022-07-16 DIAGNOSIS — Z0001 Encounter for general adult medical examination with abnormal findings: Secondary | ICD-10-CM | POA: Diagnosis not present

## 2022-07-16 DIAGNOSIS — E11649 Type 2 diabetes mellitus with hypoglycemia without coma: Secondary | ICD-10-CM | POA: Diagnosis not present

## 2022-07-16 DIAGNOSIS — D508 Other iron deficiency anemias: Secondary | ICD-10-CM | POA: Diagnosis not present

## 2022-07-27 ENCOUNTER — Ambulatory Visit (INDEPENDENT_AMBULATORY_CARE_PROVIDER_SITE_OTHER): Payer: Medicare HMO

## 2022-07-27 DIAGNOSIS — J309 Allergic rhinitis, unspecified: Secondary | ICD-10-CM | POA: Diagnosis not present

## 2022-08-12 ENCOUNTER — Ambulatory Visit (INDEPENDENT_AMBULATORY_CARE_PROVIDER_SITE_OTHER): Payer: Medicare HMO

## 2022-08-12 DIAGNOSIS — J309 Allergic rhinitis, unspecified: Secondary | ICD-10-CM | POA: Diagnosis not present

## 2022-08-20 DIAGNOSIS — E11649 Type 2 diabetes mellitus with hypoglycemia without coma: Secondary | ICD-10-CM | POA: Diagnosis not present

## 2022-08-20 DIAGNOSIS — M79641 Pain in right hand: Secondary | ICD-10-CM | POA: Diagnosis not present

## 2022-08-20 DIAGNOSIS — I119 Hypertensive heart disease without heart failure: Secondary | ICD-10-CM | POA: Diagnosis not present

## 2022-08-20 DIAGNOSIS — N182 Chronic kidney disease, stage 2 (mild): Secondary | ICD-10-CM | POA: Diagnosis not present

## 2022-08-20 DIAGNOSIS — D508 Other iron deficiency anemias: Secondary | ICD-10-CM | POA: Diagnosis not present

## 2022-08-20 DIAGNOSIS — E559 Vitamin D deficiency, unspecified: Secondary | ICD-10-CM | POA: Diagnosis not present

## 2022-08-20 DIAGNOSIS — E7800 Pure hypercholesterolemia, unspecified: Secondary | ICD-10-CM | POA: Diagnosis not present

## 2022-08-23 DIAGNOSIS — Z8719 Personal history of other diseases of the digestive system: Secondary | ICD-10-CM | POA: Diagnosis not present

## 2022-08-23 DIAGNOSIS — Z8589 Personal history of malignant neoplasm of other organs and systems: Secondary | ICD-10-CM | POA: Diagnosis not present

## 2022-08-23 DIAGNOSIS — R935 Abnormal findings on diagnostic imaging of other abdominal regions, including retroperitoneum: Secondary | ICD-10-CM | POA: Diagnosis not present

## 2022-08-23 DIAGNOSIS — K259 Gastric ulcer, unspecified as acute or chronic, without hemorrhage or perforation: Secondary | ICD-10-CM | POA: Diagnosis not present

## 2022-08-27 DIAGNOSIS — M79641 Pain in right hand: Secondary | ICD-10-CM | POA: Diagnosis not present

## 2022-08-27 DIAGNOSIS — M19041 Primary osteoarthritis, right hand: Secondary | ICD-10-CM | POA: Diagnosis not present

## 2022-08-31 ENCOUNTER — Ambulatory Visit (INDEPENDENT_AMBULATORY_CARE_PROVIDER_SITE_OTHER): Payer: Medicare HMO

## 2022-08-31 DIAGNOSIS — J309 Allergic rhinitis, unspecified: Secondary | ICD-10-CM | POA: Diagnosis not present

## 2022-09-01 DIAGNOSIS — D509 Iron deficiency anemia, unspecified: Secondary | ICD-10-CM | POA: Diagnosis not present

## 2022-09-02 DIAGNOSIS — C170 Malignant neoplasm of duodenum: Secondary | ICD-10-CM | POA: Diagnosis not present

## 2022-09-02 DIAGNOSIS — D509 Iron deficiency anemia, unspecified: Secondary | ICD-10-CM | POA: Diagnosis not present

## 2022-09-02 DIAGNOSIS — Z888 Allergy status to other drugs, medicaments and biological substances status: Secondary | ICD-10-CM | POA: Diagnosis not present

## 2022-09-09 DIAGNOSIS — M1A0410 Idiopathic chronic gout, right hand, without tophus (tophi): Secondary | ICD-10-CM | POA: Diagnosis not present

## 2022-09-29 ENCOUNTER — Ambulatory Visit (INDEPENDENT_AMBULATORY_CARE_PROVIDER_SITE_OTHER): Payer: Medicare HMO

## 2022-09-29 DIAGNOSIS — J309 Allergic rhinitis, unspecified: Secondary | ICD-10-CM

## 2022-10-01 DIAGNOSIS — M79642 Pain in left hand: Secondary | ICD-10-CM | POA: Diagnosis not present

## 2022-10-27 ENCOUNTER — Ambulatory Visit (INDEPENDENT_AMBULATORY_CARE_PROVIDER_SITE_OTHER): Payer: Medicare HMO

## 2022-10-27 DIAGNOSIS — J309 Allergic rhinitis, unspecified: Secondary | ICD-10-CM

## 2022-11-03 DIAGNOSIS — M10042 Idiopathic gout, left hand: Secondary | ICD-10-CM | POA: Diagnosis not present

## 2022-11-15 DIAGNOSIS — M1990 Unspecified osteoarthritis, unspecified site: Secondary | ICD-10-CM | POA: Diagnosis not present

## 2022-11-15 DIAGNOSIS — M79642 Pain in left hand: Secondary | ICD-10-CM | POA: Diagnosis not present

## 2022-11-19 NOTE — Progress Notes (Signed)
EXP 11/20/23

## 2022-11-24 ENCOUNTER — Ambulatory Visit (INDEPENDENT_AMBULATORY_CARE_PROVIDER_SITE_OTHER): Payer: Medicare HMO

## 2022-11-24 DIAGNOSIS — J309 Allergic rhinitis, unspecified: Secondary | ICD-10-CM | POA: Diagnosis not present

## 2022-11-26 DIAGNOSIS — J302 Other seasonal allergic rhinitis: Secondary | ICD-10-CM | POA: Diagnosis not present

## 2022-11-29 DIAGNOSIS — J3081 Allergic rhinitis due to animal (cat) (dog) hair and dander: Secondary | ICD-10-CM | POA: Diagnosis not present

## 2022-11-30 DIAGNOSIS — J301 Allergic rhinitis due to pollen: Secondary | ICD-10-CM | POA: Diagnosis not present

## 2022-12-03 DIAGNOSIS — D509 Iron deficiency anemia, unspecified: Secondary | ICD-10-CM | POA: Diagnosis not present

## 2022-12-15 DIAGNOSIS — N182 Chronic kidney disease, stage 2 (mild): Secondary | ICD-10-CM | POA: Diagnosis not present

## 2022-12-15 DIAGNOSIS — I10 Essential (primary) hypertension: Secondary | ICD-10-CM | POA: Diagnosis not present

## 2022-12-17 DIAGNOSIS — E785 Hyperlipidemia, unspecified: Secondary | ICD-10-CM | POA: Diagnosis not present

## 2022-12-17 DIAGNOSIS — E1165 Type 2 diabetes mellitus with hyperglycemia: Secondary | ICD-10-CM | POA: Diagnosis not present

## 2022-12-17 DIAGNOSIS — N390 Urinary tract infection, site not specified: Secondary | ICD-10-CM | POA: Diagnosis not present

## 2022-12-17 DIAGNOSIS — I10 Essential (primary) hypertension: Secondary | ICD-10-CM | POA: Diagnosis not present

## 2022-12-17 DIAGNOSIS — N182 Chronic kidney disease, stage 2 (mild): Secondary | ICD-10-CM | POA: Diagnosis not present

## 2022-12-22 ENCOUNTER — Ambulatory Visit (INDEPENDENT_AMBULATORY_CARE_PROVIDER_SITE_OTHER): Payer: Medicare HMO

## 2022-12-22 DIAGNOSIS — J309 Allergic rhinitis, unspecified: Secondary | ICD-10-CM

## 2022-12-23 ENCOUNTER — Encounter: Payer: Self-pay | Admitting: Internal Medicine

## 2022-12-23 ENCOUNTER — Ambulatory Visit: Payer: Medicare HMO | Admitting: Internal Medicine

## 2022-12-23 VITALS — BP 124/62 | HR 76 | Temp 98.2°F | Resp 16

## 2022-12-23 DIAGNOSIS — J3089 Other allergic rhinitis: Secondary | ICD-10-CM | POA: Diagnosis not present

## 2022-12-23 DIAGNOSIS — J302 Other seasonal allergic rhinitis: Secondary | ICD-10-CM

## 2022-12-23 NOTE — Progress Notes (Signed)
FOLLOW UP Date of Service/Encounter:  12/23/22   Subjective:  Javier Ross (DOB: 03-13-1935) is a 88 y.o. male who returns to the [facility] on 12/23/2022 in re-evaluation of the following: Allergic rhinitis History obtained from: chart review and patient.  For Review, LV was on 08/07/19  with Dr. Shaune Leeks seen for routine follow-up.He was doing well on q4 weeks allergy injections not requiring allergy medications.  He has been on maintenance dosing sinc eat least 2016 (before which we do not have electronic records).   He is on 3 vials-Vial 1 (trees and grass) vial 2 (horse) and vial 3 (weeds and mold)  Today presents for follow-up. He reports today that he has been on allergy injections since the 1990s.  Throughout his course of allergy injections, he initially did not note benefit.  However after some time his symptoms cleared up. He did have a hobby with horses, and at some point during the course of his injections while still symptomatic, he was tested for horse and found to be positive.  This is when his third vial was created including horse only.  After adding horse injections, his symptoms completely resolved. He did well for many years until a few years ago when he started to notice that from February to May he will have a very clear mild rhinorrhea.  He no longer takes any allergy medications, and reports the symptom is very mild. He has tolerated his allergy injections, and is open to the idea of discontinuing these given his length of therapy.  We reviewed current literature and guidelines which recommend 3 to 5 years total of therapy which he has more than completed. 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 He is otherwise in very good health.  He does take medications for high blood pressure, but is not on a beta-blocker.  Allergies as of 12/23/2022   No Known Allergies      Medication List        Accurate as of December 23, 2022  1:12 PM. If you have any  questions, ask your nurse or doctor.          STOP taking these medications    aspirin 81 MG tablet Stopped by: Clemon Chambers, MD   esomeprazole 40 MG capsule Commonly known as: Perry by: Clemon Chambers, MD   influenza vaccine adjuvanted 0.5 ML injection Commonly known as: FLUAD Stopped by: Clemon Chambers, MD   mometasone 50 MCG/ACT nasal spray Commonly known as: NASONEX Stopped by: Clemon Chambers, MD   triamterene-hydrochlorothiazide 75-50 MG tablet Commonly known as: MAXZIDE Stopped by: Clemon Chambers, MD       TAKE these medications    aMILoride 5 MG tablet Commonly known as: MIDAMOR Take 5 mg by mouth daily.   amLODipine-benazepril 10-40 MG capsule Commonly known as: LOTREL Take by mouth.   atorvastatin 10 MG tablet Commonly known as: LIPITOR Take 10 mg by mouth daily.   EPINEPHrine 0.3 mg/0.3 mL Soaj injection Commonly known as: EpiPen 2-Pak Inject 0.3 mLs (0.3 mg total) into the muscle once.   finasteride 5 MG tablet Commonly known as: PROSCAR Take 5 mg by mouth daily. What changed: Another medication with the same name was removed. Continue taking this medication, and follow the directions you see here. Changed by: Clemon Chambers, MD   FOLATE PO Take 800 mg by mouth daily.   Iron 325 (65 Fe) MG Tabs Take by mouth.   niacin-lovastatin 500-20  MG 24 hr tablet Commonly known as: ADVICOR Take 1 tablet by mouth at bedtime.   omeprazole 40 MG capsule Commonly known as: PRILOSEC Take 40 mg by mouth daily.   vitamin C 1000 MG tablet Take 1,000 mg by mouth daily.   VITAMIN D-3 PO Take 1 capsule by mouth daily.       Past Medical History:  Diagnosis Date   BPH (benign prostatic hyperplasia)    High cholesterol    Hypertension    Vitamin D deficiency    Past Surgical History:  Procedure Laterality Date   ANKLE SURGERY     HERNIA REPAIR     Otherwise, there have been no changes to his past medical history, surgical history, family  history, or social history.  ROS: All others negative except as noted per HPI.   Objective:  BP 124/62   Pulse 76   Temp 98.2 F (36.8 C) (Temporal)   Resp 16   SpO2 98%  There is no height or weight on file to calculate BMI. Physical Exam: General Appearance:  Alert, cooperative, no distress, appears stated age  Head:  Normocephalic, without obvious abnormality, atraumatic  Eyes:  Conjunctiva clear, EOM's intact  Nose: Nares normal, normal mucosa  Throat: Lips, tongue normal; teeth and gums normal, normal posterior oropharynx  Neck: Supple, symmetrical  Lungs:   clear to auscultation bilaterally, Respirations unlabored, no coughing  Heart:  II/VI systolic murmur and regular rate and rhythm, Appears well perfused  Extremities: No edema  Skin: Skin color, texture, turgor normal, no rashes or lesions on visualized portions of skin  Neurologic: No gross deficits   Assessment/Plan  Mr. Care is a very pleasant 88 yo M who has completed > 30 years of allergy injections which have significantly improved his symptoms of allergic rhinitis.  We discussed current guidelines and recommendations and exploref the possibility of discontinuing his allergy injections.  He is in agreement with plan.  He is welcome to come back for social visits at his leisure.  He will let us know if his allergies become bothersome again.  We will discontinue allergy injections at this time. Claritin 10 mg-take 1 tablet once a day if needed for runny nose or itchy eyes Nasacort 1 spray per nostril  twice a day if needed for stuffy nose   He will follow-up as needed  Sigurd Sos, MD  [facility]

## 2022-12-23 NOTE — Patient Instructions (Signed)
We will discontinue allergy injections at this time. Claritin 10 mg-take 1 tablet once a day if needed for runny nose or itchy eyes Nasacort 1 spray per nostril  twice a day if needed for stuffy nose   He will follow-up as needed

## 2023-01-10 DIAGNOSIS — M1A09X0 Idiopathic chronic gout, multiple sites, without tophus (tophi): Secondary | ICD-10-CM | POA: Diagnosis not present

## 2023-01-10 DIAGNOSIS — M25571 Pain in right ankle and joints of right foot: Secondary | ICD-10-CM | POA: Diagnosis not present

## 2023-01-22 DIAGNOSIS — M109 Gout, unspecified: Secondary | ICD-10-CM | POA: Diagnosis not present

## 2023-01-22 DIAGNOSIS — Z79899 Other long term (current) drug therapy: Secondary | ICD-10-CM | POA: Diagnosis not present

## 2023-01-22 DIAGNOSIS — I10 Essential (primary) hypertension: Secondary | ICD-10-CM | POA: Diagnosis not present

## 2023-02-10 DIAGNOSIS — E11649 Type 2 diabetes mellitus with hypoglycemia without coma: Secondary | ICD-10-CM | POA: Diagnosis not present

## 2023-02-10 DIAGNOSIS — N182 Chronic kidney disease, stage 2 (mild): Secondary | ICD-10-CM | POA: Diagnosis not present

## 2023-02-10 DIAGNOSIS — E559 Vitamin D deficiency, unspecified: Secondary | ICD-10-CM | POA: Diagnosis not present

## 2023-02-10 DIAGNOSIS — M79641 Pain in right hand: Secondary | ICD-10-CM | POA: Diagnosis not present

## 2023-02-10 DIAGNOSIS — Z125 Encounter for screening for malignant neoplasm of prostate: Secondary | ICD-10-CM | POA: Diagnosis not present

## 2023-02-10 DIAGNOSIS — I119 Hypertensive heart disease without heart failure: Secondary | ICD-10-CM | POA: Diagnosis not present

## 2023-02-10 DIAGNOSIS — Z0001 Encounter for general adult medical examination with abnormal findings: Secondary | ICD-10-CM | POA: Diagnosis not present

## 2023-02-10 DIAGNOSIS — D508 Other iron deficiency anemias: Secondary | ICD-10-CM | POA: Diagnosis not present

## 2023-02-10 DIAGNOSIS — M1A09X0 Idiopathic chronic gout, multiple sites, without tophus (tophi): Secondary | ICD-10-CM | POA: Diagnosis not present

## 2023-02-10 DIAGNOSIS — E7800 Pure hypercholesterolemia, unspecified: Secondary | ICD-10-CM | POA: Diagnosis not present

## 2023-02-28 DIAGNOSIS — M064 Inflammatory polyarthropathy: Secondary | ICD-10-CM | POA: Diagnosis not present

## 2023-02-28 DIAGNOSIS — Z6824 Body mass index (BMI) 24.0-24.9, adult: Secondary | ICD-10-CM | POA: Diagnosis not present

## 2023-02-28 DIAGNOSIS — M79642 Pain in left hand: Secondary | ICD-10-CM | POA: Diagnosis not present

## 2023-02-28 DIAGNOSIS — M79641 Pain in right hand: Secondary | ICD-10-CM | POA: Diagnosis not present

## 2023-02-28 DIAGNOSIS — R5382 Chronic fatigue, unspecified: Secondary | ICD-10-CM | POA: Diagnosis not present

## 2023-03-03 DIAGNOSIS — D509 Iron deficiency anemia, unspecified: Secondary | ICD-10-CM | POA: Diagnosis not present

## 2023-03-07 DIAGNOSIS — E11649 Type 2 diabetes mellitus with hypoglycemia without coma: Secondary | ICD-10-CM | POA: Diagnosis not present

## 2023-03-07 DIAGNOSIS — M1990 Unspecified osteoarthritis, unspecified site: Secondary | ICD-10-CM | POA: Diagnosis not present

## 2023-03-07 DIAGNOSIS — E559 Vitamin D deficiency, unspecified: Secondary | ICD-10-CM | POA: Diagnosis not present

## 2023-03-07 DIAGNOSIS — I119 Hypertensive heart disease without heart failure: Secondary | ICD-10-CM | POA: Diagnosis not present

## 2023-03-07 DIAGNOSIS — D508 Other iron deficiency anemias: Secondary | ICD-10-CM | POA: Diagnosis not present

## 2023-03-07 DIAGNOSIS — N182 Chronic kidney disease, stage 2 (mild): Secondary | ICD-10-CM | POA: Diagnosis not present

## 2023-03-15 DIAGNOSIS — M79642 Pain in left hand: Secondary | ICD-10-CM | POA: Diagnosis not present

## 2023-03-15 DIAGNOSIS — Z6824 Body mass index (BMI) 24.0-24.9, adult: Secondary | ICD-10-CM | POA: Diagnosis not present

## 2023-03-15 DIAGNOSIS — R5382 Chronic fatigue, unspecified: Secondary | ICD-10-CM | POA: Diagnosis not present

## 2023-03-15 DIAGNOSIS — M0609 Rheumatoid arthritis without rheumatoid factor, multiple sites: Secondary | ICD-10-CM | POA: Diagnosis not present

## 2023-03-15 DIAGNOSIS — M79641 Pain in right hand: Secondary | ICD-10-CM | POA: Diagnosis not present

## 2023-04-04 DIAGNOSIS — R3589 Other polyuria: Secondary | ICD-10-CM | POA: Diagnosis not present

## 2023-04-04 DIAGNOSIS — R972 Elevated prostate specific antigen [PSA]: Secondary | ICD-10-CM | POA: Diagnosis not present

## 2023-04-04 DIAGNOSIS — R339 Retention of urine, unspecified: Secondary | ICD-10-CM | POA: Diagnosis not present

## 2023-04-04 DIAGNOSIS — R300 Dysuria: Secondary | ICD-10-CM | POA: Diagnosis not present

## 2023-04-05 DIAGNOSIS — M0609 Rheumatoid arthritis without rheumatoid factor, multiple sites: Secondary | ICD-10-CM | POA: Diagnosis not present

## 2023-04-05 DIAGNOSIS — R5382 Chronic fatigue, unspecified: Secondary | ICD-10-CM | POA: Diagnosis not present

## 2023-04-05 DIAGNOSIS — Z6824 Body mass index (BMI) 24.0-24.9, adult: Secondary | ICD-10-CM | POA: Diagnosis not present

## 2023-04-05 DIAGNOSIS — M79641 Pain in right hand: Secondary | ICD-10-CM | POA: Diagnosis not present

## 2023-04-05 DIAGNOSIS — M79642 Pain in left hand: Secondary | ICD-10-CM | POA: Diagnosis not present

## 2023-04-12 DIAGNOSIS — N429 Disorder of prostate, unspecified: Secondary | ICD-10-CM | POA: Diagnosis not present

## 2023-04-12 DIAGNOSIS — R972 Elevated prostate specific antigen [PSA]: Secondary | ICD-10-CM | POA: Diagnosis not present

## 2023-04-21 DIAGNOSIS — R972 Elevated prostate specific antigen [PSA]: Secondary | ICD-10-CM | POA: Diagnosis not present

## 2023-04-21 DIAGNOSIS — I10 Essential (primary) hypertension: Secondary | ICD-10-CM | POA: Diagnosis not present

## 2023-05-09 DIAGNOSIS — N182 Chronic kidney disease, stage 2 (mild): Secondary | ICD-10-CM | POA: Diagnosis not present

## 2023-05-09 DIAGNOSIS — E11649 Type 2 diabetes mellitus with hypoglycemia without coma: Secondary | ICD-10-CM | POA: Diagnosis not present

## 2023-05-09 DIAGNOSIS — E1122 Type 2 diabetes mellitus with diabetic chronic kidney disease: Secondary | ICD-10-CM | POA: Diagnosis not present

## 2023-05-09 DIAGNOSIS — D508 Other iron deficiency anemias: Secondary | ICD-10-CM | POA: Diagnosis not present

## 2023-05-09 DIAGNOSIS — E559 Vitamin D deficiency, unspecified: Secondary | ICD-10-CM | POA: Diagnosis not present

## 2023-05-09 DIAGNOSIS — I119 Hypertensive heart disease without heart failure: Secondary | ICD-10-CM | POA: Diagnosis not present

## 2023-05-17 DIAGNOSIS — M79642 Pain in left hand: Secondary | ICD-10-CM | POA: Diagnosis not present

## 2023-05-17 DIAGNOSIS — Z6824 Body mass index (BMI) 24.0-24.9, adult: Secondary | ICD-10-CM | POA: Diagnosis not present

## 2023-05-17 DIAGNOSIS — R5382 Chronic fatigue, unspecified: Secondary | ICD-10-CM | POA: Diagnosis not present

## 2023-05-17 DIAGNOSIS — M0609 Rheumatoid arthritis without rheumatoid factor, multiple sites: Secondary | ICD-10-CM | POA: Diagnosis not present

## 2023-05-17 DIAGNOSIS — M79641 Pain in right hand: Secondary | ICD-10-CM | POA: Diagnosis not present

## 2023-06-09 DIAGNOSIS — D509 Iron deficiency anemia, unspecified: Secondary | ICD-10-CM | POA: Diagnosis not present

## 2023-06-15 DIAGNOSIS — R809 Proteinuria, unspecified: Secondary | ICD-10-CM | POA: Diagnosis not present

## 2023-06-15 DIAGNOSIS — N182 Chronic kidney disease, stage 2 (mild): Secondary | ICD-10-CM | POA: Diagnosis not present

## 2023-06-17 DIAGNOSIS — I10 Essential (primary) hypertension: Secondary | ICD-10-CM | POA: Diagnosis not present

## 2023-06-17 DIAGNOSIS — R601 Generalized edema: Secondary | ICD-10-CM | POA: Diagnosis not present

## 2023-06-17 DIAGNOSIS — E785 Hyperlipidemia, unspecified: Secondary | ICD-10-CM | POA: Diagnosis not present

## 2023-06-17 DIAGNOSIS — N390 Urinary tract infection, site not specified: Secondary | ICD-10-CM | POA: Diagnosis not present

## 2023-06-17 DIAGNOSIS — E119 Type 2 diabetes mellitus without complications: Secondary | ICD-10-CM | POA: Diagnosis not present

## 2023-06-17 DIAGNOSIS — N182 Chronic kidney disease, stage 2 (mild): Secondary | ICD-10-CM | POA: Diagnosis not present

## 2023-06-17 DIAGNOSIS — D509 Iron deficiency anemia, unspecified: Secondary | ICD-10-CM | POA: Diagnosis not present

## 2023-06-28 DIAGNOSIS — M79642 Pain in left hand: Secondary | ICD-10-CM | POA: Diagnosis not present

## 2023-06-28 DIAGNOSIS — R5382 Chronic fatigue, unspecified: Secondary | ICD-10-CM | POA: Diagnosis not present

## 2023-06-28 DIAGNOSIS — M79641 Pain in right hand: Secondary | ICD-10-CM | POA: Diagnosis not present

## 2023-06-28 DIAGNOSIS — Z6823 Body mass index (BMI) 23.0-23.9, adult: Secondary | ICD-10-CM | POA: Diagnosis not present

## 2023-06-28 DIAGNOSIS — M0609 Rheumatoid arthritis without rheumatoid factor, multiple sites: Secondary | ICD-10-CM | POA: Diagnosis not present

## 2023-07-20 DIAGNOSIS — Z0001 Encounter for general adult medical examination with abnormal findings: Secondary | ICD-10-CM | POA: Diagnosis not present

## 2023-07-25 DIAGNOSIS — I10 Essential (primary) hypertension: Secondary | ICD-10-CM | POA: Diagnosis not present

## 2023-07-25 DIAGNOSIS — R809 Proteinuria, unspecified: Secondary | ICD-10-CM | POA: Diagnosis not present

## 2023-07-25 DIAGNOSIS — N182 Chronic kidney disease, stage 2 (mild): Secondary | ICD-10-CM | POA: Diagnosis not present

## 2023-07-28 DIAGNOSIS — R3589 Other polyuria: Secondary | ICD-10-CM | POA: Diagnosis not present

## 2023-07-28 DIAGNOSIS — R972 Elevated prostate specific antigen [PSA]: Secondary | ICD-10-CM | POA: Diagnosis not present

## 2023-07-29 DIAGNOSIS — N182 Chronic kidney disease, stage 2 (mild): Secondary | ICD-10-CM | POA: Diagnosis not present

## 2023-07-29 DIAGNOSIS — D509 Iron deficiency anemia, unspecified: Secondary | ICD-10-CM | POA: Diagnosis not present

## 2023-07-29 DIAGNOSIS — I10 Essential (primary) hypertension: Secondary | ICD-10-CM | POA: Diagnosis not present

## 2023-07-29 DIAGNOSIS — I2510 Atherosclerotic heart disease of native coronary artery without angina pectoris: Secondary | ICD-10-CM | POA: Diagnosis not present

## 2023-07-29 DIAGNOSIS — N390 Urinary tract infection, site not specified: Secondary | ICD-10-CM | POA: Diagnosis not present

## 2023-08-15 DIAGNOSIS — K0889 Other specified disorders of teeth and supporting structures: Secondary | ICD-10-CM | POA: Diagnosis not present

## 2023-08-15 DIAGNOSIS — I10 Essential (primary) hypertension: Secondary | ICD-10-CM | POA: Diagnosis not present

## 2023-09-01 DIAGNOSIS — M0609 Rheumatoid arthritis without rheumatoid factor, multiple sites: Secondary | ICD-10-CM | POA: Diagnosis not present

## 2023-09-01 DIAGNOSIS — N182 Chronic kidney disease, stage 2 (mild): Secondary | ICD-10-CM | POA: Diagnosis not present

## 2023-09-01 DIAGNOSIS — L659 Nonscarring hair loss, unspecified: Secondary | ICD-10-CM | POA: Diagnosis not present

## 2023-09-01 DIAGNOSIS — Z6823 Body mass index (BMI) 23.0-23.9, adult: Secondary | ICD-10-CM | POA: Diagnosis not present

## 2023-09-01 DIAGNOSIS — R5382 Chronic fatigue, unspecified: Secondary | ICD-10-CM | POA: Diagnosis not present

## 2023-09-01 DIAGNOSIS — M79642 Pain in left hand: Secondary | ICD-10-CM | POA: Diagnosis not present

## 2023-09-01 DIAGNOSIS — M79641 Pain in right hand: Secondary | ICD-10-CM | POA: Diagnosis not present

## 2023-09-02 DIAGNOSIS — M0609 Rheumatoid arthritis without rheumatoid factor, multiple sites: Secondary | ICD-10-CM | POA: Diagnosis not present

## 2023-09-07 DIAGNOSIS — D508 Other iron deficiency anemias: Secondary | ICD-10-CM | POA: Diagnosis not present

## 2023-09-07 DIAGNOSIS — D509 Iron deficiency anemia, unspecified: Secondary | ICD-10-CM | POA: Diagnosis not present

## 2023-10-31 DIAGNOSIS — N138 Other obstructive and reflux uropathy: Secondary | ICD-10-CM | POA: Diagnosis not present

## 2023-11-04 DIAGNOSIS — R3911 Hesitancy of micturition: Secondary | ICD-10-CM | POA: Diagnosis not present

## 2023-11-04 DIAGNOSIS — N390 Urinary tract infection, site not specified: Secondary | ICD-10-CM | POA: Diagnosis not present

## 2023-11-04 DIAGNOSIS — E119 Type 2 diabetes mellitus without complications: Secondary | ICD-10-CM | POA: Diagnosis not present

## 2023-11-04 DIAGNOSIS — D508 Other iron deficiency anemias: Secondary | ICD-10-CM | POA: Diagnosis not present

## 2023-11-04 DIAGNOSIS — N182 Chronic kidney disease, stage 2 (mild): Secondary | ICD-10-CM | POA: Diagnosis not present

## 2023-11-04 DIAGNOSIS — I10 Essential (primary) hypertension: Secondary | ICD-10-CM | POA: Diagnosis not present

## 2023-12-01 DIAGNOSIS — R5382 Chronic fatigue, unspecified: Secondary | ICD-10-CM | POA: Diagnosis not present

## 2023-12-01 DIAGNOSIS — M0609 Rheumatoid arthritis without rheumatoid factor, multiple sites: Secondary | ICD-10-CM | POA: Diagnosis not present

## 2023-12-01 DIAGNOSIS — Z6823 Body mass index (BMI) 23.0-23.9, adult: Secondary | ICD-10-CM | POA: Diagnosis not present

## 2023-12-01 DIAGNOSIS — N182 Chronic kidney disease, stage 2 (mild): Secondary | ICD-10-CM | POA: Diagnosis not present

## 2023-12-01 DIAGNOSIS — L659 Nonscarring hair loss, unspecified: Secondary | ICD-10-CM | POA: Diagnosis not present

## 2024-02-09 DIAGNOSIS — M1A09X0 Idiopathic chronic gout, multiple sites, without tophus (tophi): Secondary | ICD-10-CM | POA: Diagnosis not present

## 2024-02-09 DIAGNOSIS — E559 Vitamin D deficiency, unspecified: Secondary | ICD-10-CM | POA: Diagnosis not present

## 2024-02-09 DIAGNOSIS — N182 Chronic kidney disease, stage 2 (mild): Secondary | ICD-10-CM | POA: Diagnosis not present

## 2024-02-09 DIAGNOSIS — Z0001 Encounter for general adult medical examination with abnormal findings: Secondary | ICD-10-CM | POA: Diagnosis not present

## 2024-02-09 DIAGNOSIS — I119 Hypertensive heart disease without heart failure: Secondary | ICD-10-CM | POA: Diagnosis not present

## 2024-02-09 DIAGNOSIS — D508 Other iron deficiency anemias: Secondary | ICD-10-CM | POA: Diagnosis not present

## 2024-02-09 DIAGNOSIS — E11649 Type 2 diabetes mellitus with hypoglycemia without coma: Secondary | ICD-10-CM | POA: Diagnosis not present

## 2024-02-28 DIAGNOSIS — M79642 Pain in left hand: Secondary | ICD-10-CM | POA: Diagnosis not present

## 2024-02-28 DIAGNOSIS — L659 Nonscarring hair loss, unspecified: Secondary | ICD-10-CM | POA: Diagnosis not present

## 2024-02-28 DIAGNOSIS — R5382 Chronic fatigue, unspecified: Secondary | ICD-10-CM | POA: Diagnosis not present

## 2024-02-28 DIAGNOSIS — M0609 Rheumatoid arthritis without rheumatoid factor, multiple sites: Secondary | ICD-10-CM | POA: Diagnosis not present

## 2024-02-28 DIAGNOSIS — Z6823 Body mass index (BMI) 23.0-23.9, adult: Secondary | ICD-10-CM | POA: Diagnosis not present

## 2024-02-29 DIAGNOSIS — I10 Essential (primary) hypertension: Secondary | ICD-10-CM | POA: Diagnosis not present

## 2024-02-29 DIAGNOSIS — M0609 Rheumatoid arthritis without rheumatoid factor, multiple sites: Secondary | ICD-10-CM | POA: Diagnosis not present

## 2024-02-29 DIAGNOSIS — N182 Chronic kidney disease, stage 2 (mild): Secondary | ICD-10-CM | POA: Diagnosis not present

## 2024-03-02 DIAGNOSIS — N390 Urinary tract infection, site not specified: Secondary | ICD-10-CM | POA: Diagnosis not present

## 2024-03-02 DIAGNOSIS — E119 Type 2 diabetes mellitus without complications: Secondary | ICD-10-CM | POA: Diagnosis not present

## 2024-03-02 DIAGNOSIS — N182 Chronic kidney disease, stage 2 (mild): Secondary | ICD-10-CM | POA: Diagnosis not present

## 2024-03-02 DIAGNOSIS — I10 Essential (primary) hypertension: Secondary | ICD-10-CM | POA: Diagnosis not present

## 2024-03-02 DIAGNOSIS — I2510 Atherosclerotic heart disease of native coronary artery without angina pectoris: Secondary | ICD-10-CM | POA: Diagnosis not present

## 2024-03-08 DIAGNOSIS — I119 Hypertensive heart disease without heart failure: Secondary | ICD-10-CM | POA: Diagnosis not present

## 2024-03-08 DIAGNOSIS — D508 Other iron deficiency anemias: Secondary | ICD-10-CM | POA: Diagnosis not present

## 2024-03-08 DIAGNOSIS — N182 Chronic kidney disease, stage 2 (mild): Secondary | ICD-10-CM | POA: Diagnosis not present

## 2024-03-08 DIAGNOSIS — M1A09X0 Idiopathic chronic gout, multiple sites, without tophus (tophi): Secondary | ICD-10-CM | POA: Diagnosis not present

## 2024-03-08 DIAGNOSIS — M1990 Unspecified osteoarthritis, unspecified site: Secondary | ICD-10-CM | POA: Diagnosis not present

## 2024-03-08 DIAGNOSIS — E1129 Type 2 diabetes mellitus with other diabetic kidney complication: Secondary | ICD-10-CM | POA: Diagnosis not present

## 2024-03-08 DIAGNOSIS — D509 Iron deficiency anemia, unspecified: Secondary | ICD-10-CM | POA: Diagnosis not present

## 2024-03-08 DIAGNOSIS — E559 Vitamin D deficiency, unspecified: Secondary | ICD-10-CM | POA: Diagnosis not present

## 2024-03-08 DIAGNOSIS — R809 Proteinuria, unspecified: Secondary | ICD-10-CM | POA: Diagnosis not present
